# Patient Record
Sex: Female | Born: 1960 | Race: White | Hispanic: No | Marital: Married | Smoking: Never smoker
Health system: Southern US, Community
[De-identification: ages and names within clinical notes are randomized; demographics above are authoritative.]

## PROBLEM LIST (undated history)

## (undated) DIAGNOSIS — S3710XA Unspecified injury of ureter, initial encounter: Secondary | ICD-10-CM

## (undated) DIAGNOSIS — R87610 Atypical squamous cells of undetermined significance on cytologic smear of cervix (ASC-US): Secondary | ICD-10-CM

## (undated) DIAGNOSIS — Z973 Presence of spectacles and contact lenses: Secondary | ICD-10-CM

## (undated) DIAGNOSIS — R32 Unspecified urinary incontinence: Secondary | ICD-10-CM

## (undated) HISTORY — DX: Atypical squamous cells of undetermined significance on cytologic smear of cervix (ASC-US): R87.610

---

## 1986-05-10 HISTORY — PX: OTHER SURGICAL HISTORY: SHX169

## 1997-05-10 HISTORY — PX: APPENDECTOMY: SHX54

## 1997-09-07 HISTORY — PX: OOPHORECTOMY: SHX86

## 2020-08-12 ENCOUNTER — Other Ambulatory Visit: Payer: Self-pay | Admitting: Obstetrics and Gynecology

## 2020-08-12 DIAGNOSIS — R1909 Other intra-abdominal and pelvic swelling, mass and lump: Secondary | ICD-10-CM

## 2020-08-14 ENCOUNTER — Telehealth: Payer: Self-pay | Admitting: *Deleted

## 2020-08-14 NOTE — Telephone Encounter (Signed)
Spoke with the patient and scheduled a new patient appt on 4/26 at 10:30 am. Patient given an arrival time of 10 am. Patient given the address and phone number for the clinic. Patient also given the policy for mask and visitors. Patient has a MRI on 4/22; patient given a date of 4/29, but the patient will be working.

## 2020-08-29 ENCOUNTER — Ambulatory Visit
Admission: RE | Admit: 2020-08-29 | Discharge: 2020-08-29 | Disposition: A | Discharge status: Home / Self Care | Payer: Managed Care, Other (non HMO) | Source: Ambulatory Visit | Attending: Obstetrics and Gynecology | Admitting: Obstetrics and Gynecology

## 2020-08-29 ENCOUNTER — Other Ambulatory Visit: Payer: Self-pay

## 2020-08-29 DIAGNOSIS — R1909 Other intra-abdominal and pelvic swelling, mass and lump: Secondary | ICD-10-CM

## 2020-08-29 MED ORDER — GADOBENATE DIMEGLUMINE 529 MG/ML IV SOLN
11.0000 mL | Freq: Once | INTRAVENOUS | Status: AC | PRN
  Administered 2020-08-29: 11 mL via INTRAVENOUS

## 2020-09-01 ENCOUNTER — Encounter: Payer: Self-pay | Admitting: Gynecologic Oncology

## 2020-09-02 ENCOUNTER — Other Ambulatory Visit: Payer: Self-pay

## 2020-09-02 ENCOUNTER — Inpatient Hospital Stay: Payer: Managed Care, Other (non HMO) | Attending: Gynecologic Oncology | Admitting: Gynecologic Oncology

## 2020-09-02 ENCOUNTER — Other Ambulatory Visit: Payer: Self-pay | Admitting: Gynecologic Oncology

## 2020-09-02 ENCOUNTER — Encounter: Payer: Self-pay | Admitting: Gynecologic Oncology

## 2020-09-02 VITALS — BP 159/75 | HR 69 | Temp 97.5°F | Resp 15 | Ht 59.5 in | Wt 124.8 lb

## 2020-09-02 DIAGNOSIS — D398 Neoplasm of uncertain behavior of other specified female genital organs: Secondary | ICD-10-CM | POA: Diagnosis not present

## 2020-09-02 DIAGNOSIS — R87612 Low grade squamous intraepithelial lesion on cytologic smear of cervix (LGSIL): Secondary | ICD-10-CM

## 2020-09-02 DIAGNOSIS — Z8041 Family history of malignant neoplasm of ovary: Secondary | ICD-10-CM | POA: Insufficient documentation

## 2020-09-02 DIAGNOSIS — N950 Postmenopausal bleeding: Secondary | ICD-10-CM | POA: Diagnosis not present

## 2020-09-02 DIAGNOSIS — Z800 Family history of malignant neoplasm of digestive organs: Secondary | ICD-10-CM | POA: Diagnosis not present

## 2020-09-02 DIAGNOSIS — R87619 Unspecified abnormal cytological findings in specimens from cervix uteri: Secondary | ICD-10-CM | POA: Insufficient documentation

## 2020-09-02 DIAGNOSIS — N888 Other specified noninflammatory disorders of cervix uteri: Secondary | ICD-10-CM

## 2020-09-02 MED ORDER — IBUPROFEN 800 MG PO TABS: 800.0000 mg | ORAL_TABLET | Freq: Three times a day (TID) | ORAL | 0 refills | Status: DC | PRN

## 2020-09-02 MED ORDER — TRAMADOL HCL 50 MG PO TABS: 50.0000 mg | ORAL_TABLET | Freq: Four times a day (QID) | ORAL | 0 refills | Status: DC | PRN

## 2020-09-02 MED ORDER — SENNOSIDES-DOCUSATE SODIUM 8.6-50 MG PO TABS
2.0000 | ORAL_TABLET | Freq: Every day | ORAL | 0 refills | Status: DC
Start: 2020-09-02 — End: 2020-10-10

## 2020-09-02 NOTE — Progress Notes (Signed)
Consult Note: Gyn-Onc  Consult was requested by Dr. Wilhelmenia Blase for the evaluation of Sheila Haynes 60 y.o. female  CC:  Chief Complaint  Patient presents with  . Cervical mass  . postmenopausal bleeding    Assessment/Plan:  [name]  is a 60 y.o.  year old with postmenopausal bleeding, low grade/atypical cells on ECC, atrophic cervix and abnormal cervix on [place].  I reviewed the pathology findings and MRI images with the patient.  On MRI her cervix appears grossly normal and there is no obvious endometrial or cervical pathology visible.  Given the constellation of abnormal findings and symptoms, I would typically recommend a conization of the cervix with D&C.  However, Sheila Haynes has a cervix that is flush with the upper vagina with no palpable or visible tissue amenable to an excisional procedure.  I have some concerns that she may have an occult malignancy given her bleeding symptoms and feel that additional histologic work-up is necessary.  For this reason I am recommending proceeding with hysterectomy for both diagnostic and therapeutic purposes.  I explained to the patient that performing a hysterectomy in the absence of a preexcisional procedure runs the risk of [place] finding an occult cervical cancer that is inadequately treated by the hysterectomy alone and may require radiation therapy.  We will inject the cervix with ICG at the commencement of the procedure in case staging is required based on frozen section results.  I recommend removal of both tubes and ovaries given her concerning family history for possible uterine or ovarian cancer in a second-degree relative.  She will require an EEA sizer for delineation of the vagina.  I counseled the patient regarding risks of surgery including  bleeding, infection, damage to internal organs (such as bladder,ureters, bowels), blood clot, reoperation and rehospitalization. I also counseled the patient regarding anticipated recovery.  She  has an event that she would like to attend on May 5 and plans to travel to [place] or in the second week of June.  I feel that surgery in the second week of May will give her adequate time to recover prior to her anticipated June travel and will still allow for resumption of adjuvant therapy should that be necessary upon her return.   HPI: Ms Sheila Haynes is a 60year old P2 who was seen in consultation at the request of Dr Wilhelmenia Blase for evaluation of postmenopausal bleeding, atypical cervical cells on ECC, cervical mass on ultrasound.  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  She was referred to Dr. Wilhelmenia Blase for colposcopic evaluation approximately 6 months prior to diagnosis.  At that time colposcopy was not adequate due to atrophy.  She was prescribed vaginal estrogen which she took in March 2022.  On July 15, 2020, immediately after assuming use of vaginal estrogen, the patient developed a single episode of heavy vaginal bleeding.  She return to see Dr. Wilhelmenia Blase to further explore and work this up on August 01, 2020.  An endocervical biopsy under ultrasound guidance was performed on 08/01/2020 and revealed fragmented squamous mucosa with atypia bordering of low-grade dysplasia.  No invasive neoplasm was identified.  A transvaginal ultrasound scan was performed that same day in the office and revealed a uterus measuring 10.1 x 5.5 x 4.4 cm with an endometrial thickness of 7.24 mm.  The notes of the ultrasound report a simple right adnexal cyst measuring approximately 5 cm.  There was a's heterogeneous material or mass with increased peripheral vascularity in  the cervix that was appreciated on ultrasound.  The patient returned to the office on 08/06/2020 for an endometrial biopsy to follow-up the thickened endometrial stripe that had been seen on ultrasound.  This revealed benign squamous epithelium with no distinct endometrial tissue present on evaluation.  An MRI of the  pelvis with and without contrast was performed on 08/29/2020 to work-up the abnormal cervical findings on ultrasound imaging.  This revealed a uterus measuring 7.6 x 4.5 x 5.4 cm with no fibroids or other masses identified.  The endometrium was thin and measured approximately 4 mm.  The cervix and vagina were unremarkable.  There was no evidence of a cervical mass.  The right ovary contained a simple appearing cyst measuring 4.6 x 3.5 cm.  The left ovary was not visualized.  There was no constellations of metastatic disease with no lymphadenopathy, free fluid, or carcinomatosis.  Her medical history is fairly unremarkable.  Her surgical history is most significant for a right salpingo-oophorectomy and appendectomy in 1999 for ovarian torsion and 2 prior cesarean sections.  Her gynecologic history is remarkable for 2 prior cesarean sections.  Her family cancer history is a paternal grandmother with uterine and ovarian cancer at age 14, mother with multiple myeloma at age 20, maternal grandfather with throat cancer, and a maternal grandmother with stomach cancer.  She works as an Therapist, music. She lives with her husband.   Current Meds:  Outpatient Encounter Medications as of 09/02/2020  Medication Sig  . Omega-3 Fatty Acids (FISH OIL) 1200 MG CAPS Take 1,200 mg by mouth daily.  . [DISCONTINUED] estradiol (ESTRACE) 0.1 MG/GM vaginal cream estradiol 0.01% (0.1 mg/gram) vaginal cream  INSERT 1 G EVERY DAY BY VAGINAL ROUTE FOR 14 DAYS.   No facility-administered encounter medications on file as of 09/02/2020.    Allergy:  Allergies  Allergen Reactions  . Cephalexin Rash  . Hydromorphone Hcl Other (See Comments)    Severe Headache  . Penicillins Rash  . Sulfa Antibiotics Rash    Social Hx:   Social History   Socioeconomic History  . Marital status: Married    Spouse name: Not on file  . Number of children: Not on file  . Years of education: Not on file  . Highest education level: Not on  file  Occupational History  . Not on file  Tobacco Use  . Smoking status: Never Smoker  . Smokeless tobacco: Never Used  Vaping Use  . Vaping Use: Never used  Substance and Sexual Activity  . Alcohol use: Never  . Drug use: Not on file  . Sexual activity: Yes    Birth control/protection: Post-menopausal  Other Topics Concern  . Not on file  Social History Narrative  . Not on file   Social Determinants of Health   Financial Resource Strain: Not on file  Food Insecurity: Not on file  Transportation Needs: Not on file  Physical Activity: Not on file  Stress: Not on file  Social Connections: Not on file  Intimate Partner Violence: Not on file    Past Surgical Hx:  Past Surgical History:  Procedure Laterality Date  . APPENDECTOMY  1999  . CESAREAN SECTION  06/25/1991  . Cesaren section    . OOPHORECTOMY  09/1997    Past Medical Hx:  Past Medical History:  Diagnosis Date  . Atypical squamous cell changes of undetermined significance (ASCUS) on cervical cytology with negative high risk human papilloma virus (HPV) test result     Past Gynecological History:  C/s x 2, s/p RSO in 1999 No LMP recorded.  Family Hx:  Family History  Problem Relation Age of Onset  . Uterine cancer Mother   . Multiple myeloma Mother   . Hypertension Father   . Heart disease Father   . Diabetes Father   . Stomach cancer Maternal Grandmother   . Brain cancer Maternal Grandfather   . Uterine cancer Paternal Grandmother   . Pancreatic cancer Paternal Uncle   . Pancreatic cancer Paternal Uncle   . Colon cancer Neg Hx   . Ovarian cancer Neg Hx   . Breast cancer Neg Hx   . Prostate cancer Neg Hx     Review of Systems:  Constitutional  Feels well,    ENT Normal appearing ears and nares bilaterally Skin/Breast  No rash, sores, jaundice, itching, dryness Cardiovascular  No chest pain, shortness of breath, or edema  Pulmonary  No cough or wheeze.  Gastro Intestinal  No nausea,  vomitting, or diarrhoea. No bright red blood per rectum, no abdominal pain, change in bowel movement, or constipation.  Genito Urinary  No frequency, urgency, dysuria, + postmenopausal bleeding Musculo Skeletal  No myalgia, arthralgia, joint swelling or pain  Neurologic  No weakness, numbness, change in gait,  Psychology  No depression, anxiety, insomnia.   Vitals:  Blood pressure (!) 159/75, pulse 69, temperature (!) 97.5 F (36.4 C), temperature source Tympanic, resp. rate 15, height 4' 11.5" (1.511 m), weight 124 lb 12.8 oz (56.6 kg), SpO2 100 %.  Physical Exam: WD in NAD Neck  Supple NROM, without any enlargements.  Lymph Node Survey No cervical supraclavicular or inguinal adenopathy Cardiovascular  Well perfused peripheries Lungs  No increased WOB Skin  No rash/lesions/breakdown  Psychiatry  Alert and oriented to person, place, and time  Abdomen  Normoactive bowel sounds, abdomen soft, non-tender and nonobese without evidence of hernia.  Back No CVA tenderness Genito Urinary  Vulva/vagina: Normal external female genitalia.  No lesions. No discharge or bleeding.  Bladder/urethra:  No lesions or masses, well supported bladder  Vagina: atrophic, no visual abnormalities, palpably smooth  Cervix: flush with vagina, cervical stroma not present. The os is punctate and present at the apex of the vagina.  Uterus:  Small, mobile, no parametrial involvement or nodularity.  Adnexa: no palpable masses. Rectal  Good tone, no masses no cul de sac nodularity.  Extremities  No bilateral cyanosis, clubbing or edema.  60 minutes of total time was spent for this patient encounter, including preparation, face-to-face counseling with the patient and coordination of care, review of imaging (results and images), communication with the referring provider and documentation of the encounter.   [name], MD  09/02/2020, 11:20 AM

## 2020-09-02 NOTE — Patient Instructions (Signed)
Preparing for your Surgery  Plan for surgery on Sep 15, 2020 with Dr. Everitt Amber at [facility]. You will be scheduled for a robotic assisted total laparoscopic hysterectomy (removal of the uterus and cervix), bilateral salpingo-oophorectomy (removal of both ovaries and fallopian tubes), possible staging .   Pre-operative Testing -You will receive a phone call from presurgical testing at [facility] to arrange for a pre-operative appointment, labs, and COVID test. The COVID test normally happens 3 days prior to the surgery and they ask that you self quarantine after the test up until surgery to decrease chance of exposure.  -Bring your insurance card, copy of an advanced directive if applicable, medication list  -At that visit, you will be asked to sign a consent for a possible blood transfusion in case a transfusion becomes necessary during surgery.  The need for a blood transfusion is rare but having consent is a necessary part of your care.     -You should not be taking blood thinners or aspirin at least ten days prior to surgery unless instructed by your surgeon.  -STOP FISH OIL NOW. Do not take supplements such as fish oil (omega 3), red yeast rice, turmeric before your surgery. You want to avoid medications with aspirin in them including headache powders such as BC or Goody's), Excedrin migraine.  Day Before Surgery at [place] will be asked to take in a light diet the day before surgery. You will be advised you can have clear liquids up until 3 hours before your surgery.    Eat a light diet the day before surgery.  Examples including soups, broths, toast, yogurt, mashed potatoes.  AVOID GAS PRODUCING FOODS. Things to avoid include carbonated beverages (fizzy beverages, sodas), raw fruits and raw vegetables (uncooked), or beans.   If your bowels are filled with gas, your surgeon will have difficulty visualizing your pelvic organs which increases your  surgical risks.  Your role in recovery Your role is to become active as soon as directed by your doctor, while still giving yourself time to heal.  Rest when you feel tired. You will be asked to do the following in order to speed your recovery:  - Cough and breathe deeply. This helps to clear and expand your lungs and can prevent pneumonia after surgery.  - [place]. Do mild physical activity. Walking or moving your legs help your circulation and body functions return to normal. Do not try to get up or walk alone the first time after surgery.   -If you develop swelling on one leg or the other, pain in the back of your leg, redness/warmth in one of your legs, please call the office or go to the Emergency Room to have a doppler to rule out a blood clot. For shortness of breath, chest pain-seek care in the Emergency Room as soon as possible. - Actively manage your pain. Managing your pain lets you move in comfort. We will ask you to rate your pain on a scale of zero to 10. It is your responsibility to tell your doctor or nurse where and how much you hurt so your pain can be treated.  Special Considerations -If you are diabetic, you may be placed on insulin after surgery to have closer control over your blood sugars to promote healing and recovery.  This does not mean that you will be discharged on insulin.  If applicable, your oral antidiabetics will be resumed when you  are tolerating a solid diet.  -Your final pathology results from surgery should be available around one week after surgery and the results will be relayed to you when available.  -FMLA forms can be faxed to [phone] and please allow 5-7 business days for completion.  Pain Management After Surgery -You have been prescribed your pain medication and bowel regimen medications before surgery so that you can have these available when you are discharged from the hospital. The pain medication is for use ONLY AFTER  surgery and a new prescription will not be given.   -Make sure that you have Tylenol and Ibuprofen at home to use on a regular basis after surgery for pain control. We recommend alternating the medications every hour to six hours since they work differently and are processed in the body differently for pain relief.  -Review the attached handout on narcotic use and their risks and side effects.   Bowel Regimen -You have been prescribed Sennakot-S to take nightly to prevent constipation especially if you are taking the narcotic pain medication intermittently.  It is important to prevent constipation and drink adequate amounts of liquids. You can stop taking this medication when you are not taking pain medication and you are back on your normal bowel routine.  Risks of Surgery Risks of surgery are low but include bleeding, infection, damage to surrounding structures, re-operation, blood clots, and very rarely death.   Blood Transfusion Information (For the consent to be signed before surgery)  We will be checking your blood type before surgery so in case of emergencies, we will know what type of blood you would need.                                            WHAT IS A BLOOD TRANSFUSION?  A transfusion is the replacement of blood or some of its parts. Blood is made up of multiple cells which provide different functions.  Red blood cells carry oxygen and are used for blood loss replacement.  White blood cells fight against infection.  Platelets control bleeding.  Plasma helps clot blood.  Other blood products are available for specialized needs, such as hemophilia or other clotting disorders. BEFORE THE TRANSFUSION  Who gives blood for transfusions?   You may be able to donate blood to be used at a later date on yourself (autologous donation).  Relatives can be asked to donate blood. This is generally not any safer than if you have received blood from a stranger. The same precautions  are taken to ensure safety when a relative's blood is donated.  Healthy volunteers who are fully evaluated to make sure their blood is safe. This is blood bank blood. Transfusion therapy is the safest it has ever been in the practice of medicine. Before blood is taken from a donor, a complete history is taken to make sure that person has no history of diseases nor engages in risky social behavior (examples are intravenous drug use or sexual activity with multiple partners). The donor's travel history is screened to minimize risk of transmitting infections, such as malaria. The donated blood is tested for signs of infectious diseases, such as HIV and hepatitis. The blood is then tested to be sure it is compatible with you in order to minimize the chance of a transfusion reaction. If you or a relative donates blood, this is often done in  anticipation of surgery and is not appropriate for emergency situations. It takes many days to process the donated blood. RISKS AND COMPLICATIONS Although transfusion therapy is very safe and saves many lives, the main dangers of transfusion include:   Getting an infectious disease.  Developing a transfusion reaction. This is an allergic reaction to something in the blood you were given. Every precaution is taken to prevent this. The decision to have a blood transfusion has been considered carefully by your caregiver before blood is given. Blood is not given unless the benefits outweigh the risks.  AFTER SURGERY INSTRUCTIONS  Return to work: 4 weeks if applicable  Activity: 1. Be up and out of the bed during the day.  Take a nap if needed.  You may walk up steps but be careful and use the hand rail.  Stair climbing will tire you more than you think, you may need to stop part way and rest.   2. No lifting or straining for 6 weeks over 10 pounds. No pushing, pulling, straining for 6 weeks.  3. No driving for 1 week(s).  Do not drive if you are taking narcotic pain  medicine and make sure that your reaction time has returned.   4. You can shower as soon as the next day after surgery. Shower daily.  Use your regular soap and water (not directly on the incision) and pat your incision(s) dry afterwards; don't rub.  No tub baths or submerging your body in water until cleared by your surgeon. If you have the soap that was given to you by pre-surgical testing that was used before surgery, you do not need to use it afterwards because this can irritate your incisions.   5. No sexual activity and nothing in the vagina for 8 weeks.  6. You may experience a small amount of clear drainage from your incisions, which is normal.  If the drainage persists, increases, or changes color please call the office.  7. Do not use creams, lotions, or ointments such as neosporin on your incisions after surgery until advised by your surgeon because they can cause removal of the dermabond glue on your incisions.    8. You may experience vaginal spotting after surgery or around the 6-8 week mark from surgery when the stitches at the top of the vagina begin to dissolve.  The spotting is normal but if you experience heavy bleeding, call our office.  9. Take Tylenol or ibuprofen first for pain and only use narcotic pain medication for severe pain not relieved by the Tylenol or Ibuprofen.  Monitor your Tylenol intake to a max of 4,000 mg in a 24 hour period. You can alternate these medications after surgery.  Diet: 1. Low sodium Heart Healthy Diet is recommended but you are cleared to resume your normal (before surgery) diet after your procedure.  2. It is safe to use a laxative, such as Miralax or Colace, if you have difficulty moving your bowels. You have been prescribed Sennakot at bedtime every evening to keep bowel movements regular and to prevent constipation.    Wound Care: 1. Keep clean and dry.  Shower daily.  Reasons to call the Doctor:  Fever - Oral temperature greater than  100.4 degrees Fahrenheit  Foul-smelling vaginal discharge  Difficulty urinating  Nausea and vomiting  Increased pain at the site of the incision that is unrelieved with pain medicine.  Difficulty breathing with or without chest pain  New calf pain especially if only on one side  Sudden, continuing increased vaginal bleeding with or without clots.   Contacts: For questions or concerns you should contact:  Dr. Everitt Amber at [phone]  Joylene John, NP at [phone]  After Hours: call [phone] and have the GYN Oncologist paged/contacted (after 5 pm or on the weekends).  Messages sent via mychart are for non-urgent matters and are not responded to after hours so for urgent needs, please call the after hours number.

## 2020-09-02 NOTE — H&P (View-Only) (Signed)
Consult Note: Gyn-Onc  Consult was requested by Dr. Wilhelmenia Blase for the evaluation of Sheila Haynes 60 y.o. female  CC:  Chief Complaint  Patient presents with  . Cervical mass  . postmenopausal bleeding    Assessment/Plan:  Sheila Haynes  is a 60 y.o.  year old with postmenopausal bleeding, low grade/atypical cells on ECC, atrophic cervix and abnormal cervix on [place].  I reviewed the pathology findings and MRI images with the patient.  On MRI her cervix appears grossly normal and there is no obvious endometrial or cervical pathology visible.  Given the constellation of abnormal findings and symptoms, I would typically recommend a conization of the cervix with D&C.  However, Sheila Haynes has a cervix that is flush with the upper vagina with no palpable or visible tissue amenable to an excisional procedure.  I have some concerns that she may have an occult malignancy given her bleeding symptoms and feel that additional histologic work-up is necessary.  For this reason I am recommending proceeding with hysterectomy for both diagnostic and therapeutic purposes.  I explained to the patient that performing a hysterectomy in the absence of a preexcisional procedure runs the risk of [place] finding an occult cervical cancer that is inadequately treated by the hysterectomy alone and may require radiation therapy.  We will inject the cervix with ICG at the commencement of the procedure in case staging is required based on frozen section results.  I recommend removal of both tubes and ovaries given her concerning family history for possible uterine or ovarian cancer in a second-degree relative.  She will require an EEA sizer for delineation of the vagina.  I counseled the patient regarding risks of surgery including  bleeding, infection, damage to internal organs (such as bladder,ureters, bowels), blood clot, reoperation and rehospitalization. I also counseled the patient regarding anticipated recovery.  She  has an event that she would like to attend on May 5 and plans to travel to [place] or in the second week of June.  I feel that surgery in the second week of May will give her adequate time to recover prior to her anticipated June travel and will still allow for resumption of adjuvant therapy should that be necessary upon her return.   HPI: Sheila Haynes is a 60year old P2 who was seen in consultation at the request of Dr Wilhelmenia Blase for evaluation of postmenopausal bleeding, atypical cervical cells on ECC, cervical mass on ultrasound.  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  She was referred to Dr. Wilhelmenia Blase for colposcopic evaluation approximately 6 months prior to diagnosis.  At that time colposcopy was not adequate due to atrophy.  She was prescribed vaginal estrogen which she took in March 2022.  On July 15, 2020, immediately after assuming use of vaginal estrogen, the patient developed a single episode of heavy vaginal bleeding.  She return to see Dr. Wilhelmenia Blase to further explore and work this up on August 01, 2020.  An endocervical biopsy under ultrasound guidance was performed on 08/01/2020 and revealed fragmented squamous mucosa with atypia bordering of low-grade dysplasia.  No invasive neoplasm was identified.  A transvaginal ultrasound scan was performed that same day in the office and revealed a uterus measuring 10.1 x 5.5 x 4.4 cm with an endometrial thickness of 7.24 mm.  The notes of the ultrasound report a simple right adnexal cyst measuring approximately 5 cm.  There was a's heterogeneous material or mass with increased peripheral vascularity in  the cervix that was appreciated on ultrasound.  The patient returned to the office on 08/06/2020 for an endometrial biopsy to follow-up the thickened endometrial stripe that had been seen on ultrasound.  This revealed benign squamous epithelium with no distinct endometrial tissue present on evaluation.  An MRI of the  pelvis with and without contrast was performed on 08/29/2020 to work-up the abnormal cervical findings on ultrasound imaging.  This revealed a uterus measuring 7.6 x 4.5 x 5.4 cm with no fibroids or other masses identified.  The endometrium was thin and measured approximately 4 mm.  The cervix and vagina were unremarkable.  There was no evidence of a cervical mass.  The right ovary contained a simple appearing cyst measuring 4.6 x 3.5 cm.  The left ovary was not visualized.  There was no constellations of metastatic disease with no lymphadenopathy, free fluid, or carcinomatosis.  Her medical history is fairly unremarkable.  Her surgical history is most significant for a right salpingo-oophorectomy and appendectomy in 1999 for ovarian torsion and 2 prior cesarean sections.  Her gynecologic history is remarkable for 2 prior cesarean sections.  Her family cancer history is a paternal grandmother with uterine and ovarian cancer at age 14, mother with multiple myeloma at age 20, maternal grandfather with throat cancer, and a maternal grandmother with stomach cancer.  She works as an Therapist, music. She lives with her husband.   Current Meds:  Outpatient Encounter Medications as of 09/02/2020  Medication Sig  . Omega-3 Fatty Acids (FISH OIL) 1200 MG CAPS Take 1,200 mg by mouth daily.  . [DISCONTINUED] estradiol (ESTRACE) 0.1 MG/GM vaginal cream estradiol 0.01% (0.1 mg/gram) vaginal cream  INSERT 1 G EVERY DAY BY VAGINAL ROUTE FOR 14 DAYS.   No facility-administered encounter medications on file as of 09/02/2020.    Allergy:  Allergies  Allergen Reactions  . Cephalexin Rash  . Hydromorphone Hcl Other (See Comments)    Severe Headache  . Penicillins Rash  . Sulfa Antibiotics Rash    Social Hx:   Social History   Socioeconomic History  . Marital status: Married    Spouse name: Not on file  . Number of children: Not on file  . Years of education: Not on file  . Highest education level: Not on  file  Occupational History  . Not on file  Tobacco Use  . Smoking status: Never Smoker  . Smokeless tobacco: Never Used  Vaping Use  . Vaping Use: Never used  Substance and Sexual Activity  . Alcohol use: Never  . Drug use: Not on file  . Sexual activity: Yes    Birth control/protection: Post-menopausal  Other Topics Concern  . Not on file  Social History Narrative  . Not on file   Social Determinants of Health   Financial Resource Strain: Not on file  Food Insecurity: Not on file  Transportation Needs: Not on file  Physical Activity: Not on file  Stress: Not on file  Social Connections: Not on file  Intimate Partner Violence: Not on file    Past Surgical Hx:  Past Surgical History:  Procedure Laterality Date  . APPENDECTOMY  1999  . CESAREAN SECTION  06/25/1991  . Cesaren section    . OOPHORECTOMY  09/1997    Past Medical Hx:  Past Medical History:  Diagnosis Date  . Atypical squamous cell changes of undetermined significance (ASCUS) on cervical cytology with negative high risk human papilloma virus (HPV) test result     Past Gynecological History:  C/s x 2, s/p RSO in 1999 No LMP recorded.  Family Hx:  Family History  Problem Relation Age of Onset  . Uterine cancer Mother   . Multiple myeloma Mother   . Hypertension Father   . Heart disease Father   . Diabetes Father   . Stomach cancer Maternal Grandmother   . Brain cancer Maternal Grandfather   . Uterine cancer Paternal Grandmother   . Pancreatic cancer Paternal Uncle   . Pancreatic cancer Paternal Uncle   . Colon cancer Neg Hx   . Ovarian cancer Neg Hx   . Breast cancer Neg Hx   . Prostate cancer Neg Hx     Review of Systems:  Constitutional  Feels well,    ENT Normal appearing ears and nares bilaterally Skin/Breast  No rash, sores, jaundice, itching, dryness Cardiovascular  No chest pain, shortness of breath, or edema  Pulmonary  No cough or wheeze.  Gastro Intestinal  No nausea,  vomitting, or diarrhoea. No bright red blood per rectum, no abdominal pain, change in bowel movement, or constipation.  Genito Urinary  No frequency, urgency, dysuria, + postmenopausal bleeding Musculo Skeletal  No myalgia, arthralgia, joint swelling or pain  Neurologic  No weakness, numbness, change in gait,  Psychology  No depression, anxiety, insomnia.   Vitals:  Blood pressure (!) 159/75, pulse 69, temperature (!) 97.5 F (36.4 C), temperature source Tympanic, resp. rate 15, height 4' 11.5" (1.511 m), weight 124 lb 12.8 oz (56.6 kg), SpO2 100 %.  Physical Exam: WD in NAD Neck  Supple NROM, without any enlargements.  Lymph Node Survey No cervical supraclavicular or inguinal adenopathy Cardiovascular  Well perfused peripheries Lungs  No increased WOB Skin  No rash/lesions/breakdown  Psychiatry  Alert and oriented to person, place, and time  Abdomen  Normoactive bowel sounds, abdomen soft, non-tender and nonobese without evidence of hernia.  Back No CVA tenderness Genito Urinary  Vulva/vagina: Normal external female genitalia.  No lesions. No discharge or bleeding.  Bladder/urethra:  No lesions or masses, well supported bladder  Vagina: atrophic, no visual abnormalities, palpably smooth  Cervix: flush with vagina, cervical stroma not present. The os is punctate and present at the apex of the vagina.  Uterus:  Small, mobile, no parametrial involvement or nodularity.  Adnexa: no palpable masses. Rectal  Good tone, no masses no cul de sac nodularity.  Extremities  No bilateral cyanosis, clubbing or edema.  60 minutes of total time was spent for this patient encounter, including preparation, face-to-face counseling with the patient and coordination of care, review of imaging (results and images), communication with the referring provider and documentation of the encounter.   [name], MD  09/02/2020, 11:20 AM

## 2020-09-08 ENCOUNTER — Other Ambulatory Visit: Payer: Self-pay

## 2020-09-08 ENCOUNTER — Encounter (HOSPITAL_BASED_OUTPATIENT_CLINIC_OR_DEPARTMENT_OTHER): Payer: Self-pay | Admitting: Gynecologic Oncology

## 2020-09-08 NOTE — Progress Notes (Signed)
Spoke w/ via phone for pre-op interview---PT Lab needs dos---- NONE              Lab results------cbc cmp T & s, ua COVID test ------09-12-2020 Arrive at -------630 am 09-12-2020 NPO after MN NO Solid Food.  Clear liquids from MN until---530 am then npo Med rec completed Medications to take morning of surgery ----- none Diabetic medication -----n/a Patient instructed to bring photo id and insurance card day of surgery Patient aware to have Driver (ride ) / caregiver  Spouse eddie will stay   for 24 hours after surgery  Patient Special Instructions ----pt given extended stay instructions Pre-Op special Istructions -----none Patient verbalized understanding of instructions that were given at this phone interview. Patient denies shortness of breath, chest pain, fever, cough at this phone interview.

## 2020-09-08 NOTE — Progress Notes (Signed)
YOU ARE SCHEDULED FOR A COVID TEST 09-12-2020 @ 1030. THIS TEST MUST BE DONE BEFORE SURGERY. GO TO  [place]. [place], [place], IT IS APPROXIMATELY 2 MINUTES PAST [employer] ON THE RIGHT AND REMAIN IN YOUR CAR, THIS IS A DRIVE UP TEST. ONCE YOUR COVID TEST IS DONE PLEASE FOLLOW ALL THE QUARANTINE  INSTRUCTIONS GIVEN IN YOUR HANDOUT.      Your procedure is scheduled on 09-15-2020  Report to [facility].   Call this number if you have problems the morning of surgery  :[phone].   OUR ADDRESS IS [address].  WE ARE LOCATED IN [facility].  PLEASE BRING YOUR INSURANCE CARD AND PHOTO ID DAY OF SURGERY.  ONLY ONE PERSON ALLOWED IN FACILITY WAITING AREA.                                     REMEMBER:  DO NOT EAT FOOD, CANDY GUM OR MINTS  AFTER MIDNIGHT . YOU MAY HAVE CLEAR LIQUIDS FROM MIDNIGHT UNTIL 530 AM . NO CLEAR LIQUIDS AFTER  530 AM DAY OF SURGERY.   YOU MAY  BRUSH YOUR TEETH MORNING OF SURGERY AND RINSE YOUR MOUTH OUT, NO CHEWING GUM CANDY OR MINTS.    CLEAR LIQUID DIET   Foods Allowed                                                                     Foods Excluded  Coffee and tea, regular and decaf                             liquids that you cannot  Plain Jell-O any favor except red or purple                                           see through such as: Fruit ices (not with fruit pulp)                                     milk, soups, orange juice  Iced Popsicles                                    All solid food Carbonated beverages, regular and diet                                    Cranberry, grape and apple juices Sports drinks like Gatorade Lightly seasoned clear broth or consume(fat free) Sugar, honey syrup  Sample Menu Breakfast                                Lunch  Supper Cranberry juice                    Beef broth                            Chicken  broth Jell-O                                     Grape juice                           Apple juice Coffee or tea                        Jell-O                                      Popsicle                                                Coffee or tea                        Coffee or tea  _____________________________________________________________________     TAKE THESE MEDICATIONS MORNING OF SURGERY WITH A SIP OF WATER:  NONE  ONE VISITOR IS ALLOWED IN WAITING ROOM ONLY DAY OF SURGERY.  NO VISITOR MAY SPEND THE NIGHT.  VISITOR ARE ALLOWED TO STAY UNTIL 800 PM.                                    DO NOT WEAR JEWERLY, MAKE UP. DO NOT WEAR LOTIONS, POWDERS, PERFUMES OR DEODORANT. DO NOT SHAVE FOR 24 HOURS PRIOR TO DAY OF SURGERY. MEN MAY SHAVE FACE AND NECK. CONTACTS, GLASSES, OR DENTURES MAY NOT BE WORN TO SURGERY.                                    [facility] IS NOT RESPONSIBLE  FOR ANY BELONGINGS.                                                                    Marland Kitchen           [place] - Preparing for Surgery Before surgery, you can play an important role.  Because skin is not sterile, your skin needs to be as free of germs as possible.  You can reduce the number of germs on your skin by washing with CHG (chlorahexidine gluconate) soap before surgery.  CHG is an antiseptic cleaner which kills germs and bonds with the skin to continue killing germs even after washing. Please DO NOT use if you have an allergy to CHG or antibacterial soaps.  If your skin becomes reddened/irritated stop  using the CHG and inform your nurse when you arrive at Short Stay. Do not shave (including legs and underarms) for at least 48 hours prior to the first CHG shower.  You may shave your face/neck. Please follow these instructions carefully:  1.  Shower with CHG Soap the night before surgery and the  morning of Surgery.  2.  If you choose to wash your hair, wash your hair first as usual with your  normal   shampoo.  3.  After you shampoo, rinse your hair and body thoroughly to remove the  shampoo.                            4.  Use CHG as you would any other liquid soap.  You can apply chg directly  to the skin and wash                      Gently with a scrungie or clean washcloth.  5.  Apply the CHG Soap to your body ONLY FROM THE NECK DOWN.   Do not use on face/ open                           Wound or open sores. Avoid contact with eyes, ears mouth and genitals (private parts).                       Wash face,  Genitals (private parts) with your normal soap.             6.  Wash thoroughly, paying special attention to the area where your surgery  will be performed.  7.  Thoroughly rinse your body with warm water from the neck down.  8.  DO NOT shower/wash with your normal soap after using and rinsing off  the CHG Soap.                9.  Pat yourself dry with a clean towel.            10.  Wear clean pajamas.            11.  Place clean sheets on your bed the night of your first shower and do not  sleep with pets. Day of Surgery : Do not apply any lotions/deodorants the morning of surgery.  Please wear clean clothes to the hospital/surgery center.  FAILURE TO FOLLOW THESE INSTRUCTIONS MAY RESULT IN THE CANCELLATION OF YOUR SURGERY PATIENT SIGNATURE_________________________________  NURSE SIGNATURE__________________________________  ________________________________________________________________________                                                        QUESTIONS Sheila Haynes PRE OP NURSE PHONE [phone]

## 2020-09-12 ENCOUNTER — Other Ambulatory Visit: Payer: Self-pay

## 2020-09-12 ENCOUNTER — Other Ambulatory Visit (HOSPITAL_COMMUNITY)
Admission: RE | Admit: 2020-09-12 | Discharge: 2020-09-12 | Disposition: A | Discharge status: Home / Self Care | Payer: Managed Care, Other (non HMO) | Source: Ambulatory Visit | Attending: Gynecologic Oncology | Admitting: Gynecologic Oncology

## 2020-09-12 ENCOUNTER — Encounter (HOSPITAL_BASED_OUTPATIENT_CLINIC_OR_DEPARTMENT_OTHER): Payer: Self-pay | Admitting: Gynecologic Oncology

## 2020-09-12 ENCOUNTER — Telehealth: Payer: Self-pay | Admitting: Gynecologic Oncology

## 2020-09-12 ENCOUNTER — Encounter (HOSPITAL_COMMUNITY)
Admission: RE | Admit: 2020-09-12 | Discharge: 2020-09-12 | Disposition: A | Discharge status: Home / Self Care | Payer: Managed Care, Other (non HMO) | Source: Ambulatory Visit | Attending: Gynecologic Oncology | Admitting: Gynecologic Oncology

## 2020-09-12 DIAGNOSIS — Z20822 Contact with and (suspected) exposure to covid-19: Secondary | ICD-10-CM | POA: Insufficient documentation

## 2020-09-12 DIAGNOSIS — Z01812 Encounter for preprocedural laboratory examination: Secondary | ICD-10-CM | POA: Insufficient documentation

## 2020-09-12 LAB — COMPREHENSIVE METABOLIC PANEL
ALT: 16 U/L (ref 0–44)
AST: 18 U/L (ref 15–41)
Albumin: 4 g/dL (ref 3.5–5.0)
Alkaline Phosphatase: 51 U/L (ref 38–126)
Anion gap: 9 (ref 5–15)
BUN: 15 mg/dL (ref 6–20)
CO2: 26 mmol/L (ref 22–32)
Calcium: 9.4 mg/dL (ref 8.9–10.3)
Chloride: 104 mmol/L (ref 98–111)
Creatinine, Ser: 0.61 mg/dL (ref 0.44–1.00)
GFR, Estimated: >60 mL/min (ref >60)
Glucose, Bld: 85 mg/dL (ref 70–99)
Potassium: 4.3 mmol/L (ref 3.5–5.1)
Sodium: 139 mmol/L (ref 135–145)
Total Bilirubin: 1.2 mg/dL (ref 0.3–1.2)
Total Protein: 7.1 g/dL (ref 6.5–8.1)

## 2020-09-12 LAB — URINALYSIS, ROUTINE W REFLEX MICROSCOPIC
Bilirubin Urine: NEGATIVE
Glucose, UA: NEGATIVE mg/dL
Hgb urine dipstick: NEGATIVE
Ketones, ur: NEGATIVE mg/dL
Leukocytes,Ua: NEGATIVE
Nitrite: NEGATIVE
Protein, ur: NEGATIVE mg/dL
Specific Gravity, Urine: 1.005 (ref 1.005–1.030)
pH: 7 (ref 5.0–8.0)

## 2020-09-12 LAB — CBC
HCT: 44.6 % (ref 36.0–46.0)
Hemoglobin: 13.9 g/dL (ref 12.0–15.0)
MCH: 27.5 pg (ref 26.0–34.0)
MCHC: 31.2 g/dL (ref 30.0–36.0)
MCV: 88.1 fL (ref 80.0–100.0)
Platelets: 264 10*3/uL (ref 150–400)
RBC: 5.06 MIL/uL (ref 3.87–5.11)
RDW: 12.1 % (ref 11.5–15.5)
WBC: 8 10*3/uL (ref 4.0–10.5)
nRBC: 0 % (ref 0.0–0.2)

## 2020-09-12 NOTE — Anesthesia Preprocedure Evaluation (Addendum)
Anesthesia Evaluation  Patient identified by MRN, date of birth, ID band Patient awake    Reviewed: Allergy & Precautions, NPO status , Patient's Chart, lab work & pertinent test results  Airway Mallampati: II  TM Distance: >3 FB Neck ROM: Full    Dental no notable dental hx. (+) Dental Advisory Given, Caps   Pulmonary neg pulmonary ROS,    Pulmonary exam normal breath sounds clear to auscultation       Cardiovascular negative cardio ROS Normal cardiovascular exam Rhythm:Regular Rate:Normal     Neuro/Psych negative neurological ROS  negative psych ROS   GI/Hepatic negative GI ROS, Neg liver ROS,   Endo/Other  negative endocrine ROS  Renal/GU negative Renal ROS  negative genitourinary   Musculoskeletal negative musculoskeletal ROS (+)   Abdominal   Peds  Hematology negative hematology ROS (+)   Anesthesia Other Findings   Reproductive/Obstetrics Cervical mass PMB                            Anesthesia Physical Anesthesia Plan  ASA: II  Anesthesia Plan: General   Post-op Pain Management:    Induction: Intravenous  PONV Risk Score and Plan: 4 or greater and Treatment may vary due to age or medical condition, Midazolam, Ondansetron, Dexamethasone, Promethazine and Scopolamine patch - Pre-op  Airway Management Planned: Oral ETT  Additional Equipment:   Intra-op Plan:   Post-operative Plan: Extubation in OR  Informed Consent: I have reviewed the patients History and Physical, chart, labs and discussed the procedure including the risks, benefits and alternatives for the proposed anesthesia with the patient or authorized representative who has indicated his/her understanding and acceptance.     Dental advisory given  Plan Discussed with: CRNA and Anesthesiologist  Anesthesia Plan Comments:        Anesthesia Quick Evaluation

## 2020-09-12 NOTE — Telephone Encounter (Signed)
Attempted to call patient to check in with her to see if she had any questions or concerns before her surgery on Monday, May 9.  Left a message for the patient on her home number and cell number and asked her to please call the office for any questions or concerns.

## 2020-09-13 LAB — SARS CORONAVIRUS 2 (TAT 6-24 HRS): SARS Coronavirus 2: NEGATIVE

## 2020-09-15 ENCOUNTER — Ambulatory Visit (HOSPITAL_BASED_OUTPATIENT_CLINIC_OR_DEPARTMENT_OTHER): Payer: Managed Care, Other (non HMO) | Admitting: Anesthesiology

## 2020-09-15 ENCOUNTER — Ambulatory Visit (HOSPITAL_BASED_OUTPATIENT_CLINIC_OR_DEPARTMENT_OTHER)
Admission: RE | Admit: 2020-09-15 | Discharge: 2020-09-15 | Disposition: A | Discharge status: Home / Self Care | Payer: Managed Care, Other (non HMO) | Attending: Gynecologic Oncology | Admitting: Gynecologic Oncology

## 2020-09-15 ENCOUNTER — Encounter (HOSPITAL_BASED_OUTPATIENT_CLINIC_OR_DEPARTMENT_OTHER)
Admission: RE | Disposition: A | Discharge status: Home / Self Care | Payer: Self-pay | Source: Home / Self Care | Attending: Gynecologic Oncology

## 2020-09-15 ENCOUNTER — Encounter (HOSPITAL_BASED_OUTPATIENT_CLINIC_OR_DEPARTMENT_OTHER): Payer: Self-pay | Admitting: Gynecologic Oncology

## 2020-09-15 DIAGNOSIS — N838 Other noninflammatory disorders of ovary, fallopian tube and broad ligament: Secondary | ICD-10-CM | POA: Insufficient documentation

## 2020-09-15 DIAGNOSIS — Z8041 Family history of malignant neoplasm of ovary: Secondary | ICD-10-CM | POA: Insufficient documentation

## 2020-09-15 DIAGNOSIS — Z885 Allergy status to narcotic agent status: Secondary | ICD-10-CM | POA: Insufficient documentation

## 2020-09-15 DIAGNOSIS — Z880 Allergy status to penicillin: Secondary | ICD-10-CM | POA: Diagnosis not present

## 2020-09-15 DIAGNOSIS — N800 Endometriosis of uterus: Secondary | ICD-10-CM | POA: Insufficient documentation

## 2020-09-15 DIAGNOSIS — N950 Postmenopausal bleeding: Secondary | ICD-10-CM | POA: Insufficient documentation

## 2020-09-15 DIAGNOSIS — Z8049 Family history of malignant neoplasm of other genital organs: Secondary | ICD-10-CM | POA: Diagnosis not present

## 2020-09-15 DIAGNOSIS — N888 Other specified noninflammatory disorders of cervix uteri: Secondary | ICD-10-CM | POA: Diagnosis present

## 2020-09-15 DIAGNOSIS — N83201 Unspecified ovarian cyst, right side: Secondary | ICD-10-CM

## 2020-09-15 DIAGNOSIS — N879 Dysplasia of cervix uteri, unspecified: Secondary | ICD-10-CM | POA: Insufficient documentation

## 2020-09-15 DIAGNOSIS — Z881 Allergy status to other antibiotic agents status: Secondary | ICD-10-CM | POA: Insufficient documentation

## 2020-09-15 DIAGNOSIS — D259 Leiomyoma of uterus, unspecified: Secondary | ICD-10-CM | POA: Insufficient documentation

## 2020-09-15 DIAGNOSIS — D269 Other benign neoplasm of uterus, unspecified: Secondary | ICD-10-CM | POA: Diagnosis not present

## 2020-09-15 DIAGNOSIS — Z882 Allergy status to sulfonamides status: Secondary | ICD-10-CM | POA: Insufficient documentation

## 2020-09-15 DIAGNOSIS — R87612 Low grade squamous intraepithelial lesion on cytologic smear of cervix (LGSIL): Secondary | ICD-10-CM | POA: Diagnosis present

## 2020-09-15 HISTORY — PX: ROBOTIC ASSISTED TOTAL HYSTERECTOMY WITH BILATERAL SALPINGO OOPHERECTOMY: SHX6086

## 2020-09-15 HISTORY — DX: Presence of spectacles and contact lenses: Z97.3

## 2020-09-15 LAB — TYPE AND SCREEN
ABO/RH(D): A NEG
Antibody Screen: NEGATIVE

## 2020-09-15 LAB — ABO/RH: ABO/RH(D): A NEG

## 2020-09-15 SURGERY — HYSTERECTOMY, TOTAL, ROBOT-ASSISTED, LAPAROSCOPIC, WITH BILATERAL SALPINGO-OOPHORECTOMY
Anesthesia: General | Site: Abdomen | Laterality: Bilateral

## 2020-09-15 MED ORDER — ROCURONIUM BROMIDE 10 MG/ML (PF) SYRINGE
PREFILLED_SYRINGE | INTRAVENOUS | Status: AC
  Filled 2020-09-15: qty 10

## 2020-09-15 MED ORDER — CLINDAMYCIN PHOSPHATE 900 MG/50ML IV SOLN
INTRAVENOUS | Status: AC
  Filled 2020-09-15: qty 50

## 2020-09-15 MED ORDER — SCOPOLAMINE 1 MG/3DAYS TD PT72
1.0000 | MEDICATED_PATCH | TRANSDERMAL | Status: DC
Start: 2020-09-15 — End: 2020-09-15

## 2020-09-15 MED ORDER — ACETAMINOPHEN 500 MG PO TABS
1000.0000 mg | ORAL_TABLET | ORAL | Status: AC
  Administered 2020-09-15: 1000 mg via ORAL

## 2020-09-15 MED ORDER — ARTIFICIAL TEARS OPHTHALMIC OINT
TOPICAL_OINTMENT | OPHTHALMIC | Status: AC
  Filled 2020-09-15: qty 3.5

## 2020-09-15 MED ORDER — STERILE WATER FOR INJECTION IJ SOLN
INTRAMUSCULAR | Status: DC | PRN
  Administered 2020-09-15: 4 mL

## 2020-09-15 MED ORDER — LIDOCAINE HCL (PF) 2 % IJ SOLN
INTRAMUSCULAR | Status: DC | PRN
  Administered 2020-09-15: 1.5 mg/kg/h via INTRADERMAL

## 2020-09-15 MED ORDER — MIDAZOLAM HCL 2 MG/2ML IJ SOLN
INTRAMUSCULAR | Status: AC
  Filled 2020-09-15: qty 2

## 2020-09-15 MED ORDER — FENTANYL CITRATE (PF) 100 MCG/2ML IJ SOLN: 25.0000 ug | INTRAMUSCULAR | Status: DC | PRN

## 2020-09-15 MED ORDER — LIDOCAINE 2% (20 MG/ML) 5 ML SYRINGE
INTRAMUSCULAR | Status: DC | PRN
  Administered 2020-09-15: 80 mg via INTRAVENOUS

## 2020-09-15 MED ORDER — KETOROLAC TROMETHAMINE 30 MG/ML IJ SOLN
INTRAMUSCULAR | Status: AC
  Filled 2020-09-15: qty 1

## 2020-09-15 MED ORDER — FENTANYL CITRATE (PF) 100 MCG/2ML IJ SOLN
INTRAMUSCULAR | Status: DC | PRN
  Administered 2020-09-15 (×2): 50 ug via INTRAVENOUS
  Administered 2020-09-15: 150 ug via INTRAVENOUS
  Administered 2020-09-15: 50 ug via INTRAVENOUS

## 2020-09-15 MED ORDER — ROCURONIUM BROMIDE 10 MG/ML (PF) SYRINGE
PREFILLED_SYRINGE | INTRAVENOUS | Status: DC | PRN
  Administered 2020-09-15: 20 mg via INTRAVENOUS
  Administered 2020-09-15: 60 mg via INTRAVENOUS

## 2020-09-15 MED ORDER — DEXAMETHASONE SODIUM PHOSPHATE 10 MG/ML IJ SOLN
INTRAMUSCULAR | Status: DC | PRN
  Administered 2020-09-15: 10 mg via INTRAVENOUS

## 2020-09-15 MED ORDER — ONDANSETRON HCL 4 MG/2ML IJ SOLN: 4.0000 mg | Freq: Once | INTRAMUSCULAR | Status: DC | PRN

## 2020-09-15 MED ORDER — GABAPENTIN 100 MG PO CAPS
ORAL_CAPSULE | ORAL | Status: AC
  Filled 2020-09-15: qty 2

## 2020-09-15 MED ORDER — CLINDAMYCIN PHOSPHATE 900 MG/50ML IV SOLN
900.0000 mg | INTRAVENOUS | Status: AC
  Administered 2020-09-15: 900 mg via INTRAVENOUS

## 2020-09-15 MED ORDER — PROPOFOL 10 MG/ML IV BOLUS
INTRAVENOUS | Status: DC | PRN
  Administered 2020-09-15: 120 mg via INTRAVENOUS

## 2020-09-15 MED ORDER — FENTANYL CITRATE (PF) 100 MCG/2ML IJ SOLN
INTRAMUSCULAR | Status: AC
  Filled 2020-09-15: qty 2

## 2020-09-15 MED ORDER — LIDOCAINE HCL 2 % IJ SOLN
INTRAMUSCULAR | Status: AC
  Filled 2020-09-15: qty 10

## 2020-09-15 MED ORDER — PHENYLEPHRINE 40 MCG/ML (10ML) SYRINGE FOR IV PUSH (FOR BLOOD PRESSURE SUPPORT)
PREFILLED_SYRINGE | INTRAVENOUS | Status: AC
  Filled 2020-09-15: qty 10

## 2020-09-15 MED ORDER — BUPIVACAINE HCL 0.25 % IJ SOLN
INTRAMUSCULAR | Status: DC | PRN
  Administered 2020-09-15: 15 mL

## 2020-09-15 MED ORDER — LACTATED RINGERS IV SOLN: INTRAVENOUS | Status: DC

## 2020-09-15 MED ORDER — SCOPOLAMINE 1 MG/3DAYS TD PT72
MEDICATED_PATCH | TRANSDERMAL | Status: AC
  Filled 2020-09-15: qty 1

## 2020-09-15 MED ORDER — SODIUM CHLORIDE 0.9 % IR SOLN
Status: DC | PRN
  Administered 2020-09-15: 1000 mL

## 2020-09-15 MED ORDER — ACETAMINOPHEN 500 MG PO TABS
ORAL_TABLET | ORAL | Status: AC
  Filled 2020-09-15: qty 2

## 2020-09-15 MED ORDER — DEXAMETHASONE SODIUM PHOSPHATE 10 MG/ML IJ SOLN
INTRAMUSCULAR | Status: AC
  Filled 2020-09-15: qty 1

## 2020-09-15 MED ORDER — DEXAMETHASONE SODIUM PHOSPHATE 10 MG/ML IJ SOLN
4.0000 mg | INTRAMUSCULAR | Status: DC
Start: 2020-09-15 — End: 2020-09-15

## 2020-09-15 MED ORDER — LIDOCAINE 2% (20 MG/ML) 5 ML SYRINGE
INTRAMUSCULAR | Status: AC
  Filled 2020-09-15: qty 5

## 2020-09-15 MED ORDER — SCOPOLAMINE 1 MG/3DAYS TD PT72
1.0000 | MEDICATED_PATCH | TRANSDERMAL | Status: DC
  Administered 2020-09-15: 1.5 mg via TRANSDERMAL

## 2020-09-15 MED ORDER — SODIUM CHLORIDE (PF) 0.9 % IJ SOLN
INTRAMUSCULAR | Status: AC
  Filled 2020-09-15: qty 10

## 2020-09-15 MED ORDER — ARTIFICIAL TEARS OPHTHALMIC OINT
TOPICAL_OINTMENT | OPHTHALMIC | Status: DC | PRN
  Administered 2020-09-15: 1 via OPHTHALMIC

## 2020-09-15 MED ORDER — ONDANSETRON HCL 4 MG/2ML IJ SOLN
INTRAMUSCULAR | Status: DC | PRN
  Administered 2020-09-15: 4 mg via INTRAVENOUS

## 2020-09-15 MED ORDER — CIPROFLOXACIN IN D5W 400 MG/200ML IV SOLN
INTRAVENOUS | Status: AC
  Filled 2020-09-15: qty 200

## 2020-09-15 MED ORDER — FENTANYL CITRATE (PF) 250 MCG/5ML IJ SOLN
INTRAMUSCULAR | Status: AC
  Filled 2020-09-15: qty 5

## 2020-09-15 MED ORDER — GABAPENTIN 100 MG PO CAPS
200.0000 mg | ORAL_CAPSULE | ORAL | Status: AC
  Administered 2020-09-15: 200 mg via ORAL

## 2020-09-15 MED ORDER — PROPOFOL 10 MG/ML IV BOLUS
INTRAVENOUS | Status: AC
  Filled 2020-09-15: qty 20

## 2020-09-15 MED ORDER — PHENYLEPHRINE 40 MCG/ML (10ML) SYRINGE FOR IV PUSH (FOR BLOOD PRESSURE SUPPORT)
PREFILLED_SYRINGE | INTRAVENOUS | Status: DC | PRN
  Administered 2020-09-15 (×2): 80 ug via INTRAVENOUS

## 2020-09-15 MED ORDER — ONDANSETRON HCL 4 MG/2ML IJ SOLN
INTRAMUSCULAR | Status: AC
  Filled 2020-09-15: qty 2

## 2020-09-15 MED ORDER — KETOROLAC TROMETHAMINE 30 MG/ML IJ SOLN
INTRAMUSCULAR | Status: DC | PRN
  Administered 2020-09-15: 30 mg via INTRAVENOUS

## 2020-09-15 MED ORDER — SUGAMMADEX SODIUM 200 MG/2ML IV SOLN
INTRAVENOUS | Status: DC | PRN
  Administered 2020-09-15: 200 mg via INTRAVENOUS
  Administered 2020-09-15 (×2): 100 mg via INTRAVENOUS

## 2020-09-15 MED ORDER — MIDAZOLAM HCL 5 MG/5ML IJ SOLN
INTRAMUSCULAR | Status: DC | PRN
  Administered 2020-09-15 (×2): 1 mg via INTRAVENOUS

## 2020-09-15 MED ORDER — CIPROFLOXACIN IN D5W 400 MG/200ML IV SOLN
400.0000 mg | INTRAVENOUS | Status: AC
  Administered 2020-09-15: 400 mg via INTRAVENOUS

## 2020-09-15 SURGICAL SUPPLY — 53 items
ADH SKN CLS APL DERMABOND .7 (GAUZE/BANDAGES/DRESSINGS) ×1
COVER BACK TABLE 60X90IN (DRAPES) ×2 IMPLANT
COVER TIP SHEARS 8 DVNC (MISCELLANEOUS) ×1 IMPLANT
COVER TIP SHEARS 8MM DA VINCI (MISCELLANEOUS) ×1
COVER WAND RF STERILE (DRAPES) ×2 IMPLANT
DECANTER SPIKE VIAL GLASS SM (MISCELLANEOUS) ×2 IMPLANT
DERMABOND ADVANCED (GAUZE/BANDAGES/DRESSINGS) ×1
DERMABOND ADVANCED .7 DNX12 (GAUZE/BANDAGES/DRESSINGS) ×1 IMPLANT
DRAPE ARM DVNC X/XI (DISPOSABLE) ×4 IMPLANT
DRAPE COLUMN DVNC XI (DISPOSABLE) ×1 IMPLANT
DRAPE DA VINCI XI ARM (DISPOSABLE) ×4
DRAPE DA VINCI XI COLUMN (DISPOSABLE) ×1
DRAPE SHEET LG 3/4 BI-LAMINATE (DRAPES) ×2 IMPLANT
DRAPE SURG IRRIG POUCH 19X23 (DRAPES) ×2 IMPLANT
DRSG TEGADERM 2-3/8X2-3/4 SM (GAUZE/BANDAGES/DRESSINGS) ×2 IMPLANT
ELECT REM PT RETURN 9FT ADLT (ELECTROSURGICAL) ×2
ELECTRODE REM PT RTRN 9FT ADLT (ELECTROSURGICAL) ×1 IMPLANT
GAUZE 4X4 16PLY RFD (DISPOSABLE) ×2 IMPLANT
GLOVE SURG ENC MOIS LTX SZ6 (GLOVE) ×8 IMPLANT
GLOVE SURG ENC MOIS LTX SZ6.5 (GLOVE) ×4 IMPLANT
GLOVE SURG UNDER POLY LF SZ7 (GLOVE) ×12 IMPLANT
IRRIG SUCT STRYKERFLOW 2 WTIP (MISCELLANEOUS) ×2
IRRIGATION SUCT STRKRFLW 2 WTP (MISCELLANEOUS) ×1 IMPLANT
KIT PROCEDURE DA VINCI SI (MISCELLANEOUS) ×1
KIT PROCEDURE DVNC SI (MISCELLANEOUS) ×1 IMPLANT
KIT TURNOVER CYSTO (KITS) ×2 IMPLANT
LEGGING LITHOTOMY PAIR STRL (DRAPES) ×2 IMPLANT
MANIFOLD NEPTUNE II (INSTRUMENTS) ×2 IMPLANT
MANIPULATOR UTERINE 4.5 ZUMI (MISCELLANEOUS) ×2 IMPLANT
NEEDLE HYPO 22GX1.5 SAFETY (NEEDLE) ×2 IMPLANT
NEEDLE SPNL 18GX3.5 QUINCKE PK (NEEDLE) ×2 IMPLANT
OBTURATOR OPTICAL STANDARD 8MM (TROCAR) ×1
OBTURATOR OPTICAL STND 8 DVNC (TROCAR) ×1
OBTURATOR OPTICALSTD 8 DVNC (TROCAR) ×1 IMPLANT
PACK ROBOT GYN CUSTOM WL (TRAY / TRAY PROCEDURE) ×2 IMPLANT
PACK ROBOTIC GOWN (GOWN DISPOSABLE) ×2 IMPLANT
PAD POSITIONING PINK XL (MISCELLANEOUS) ×2 IMPLANT
PORT ACCESS TROCAR AIRSEAL 12 (TROCAR) ×1 IMPLANT
PORT ACCESS TROCAR AIRSEAL 5M (TROCAR) ×1
SEAL CANN UNIV 5-8 DVNC XI (MISCELLANEOUS) ×4 IMPLANT
SEAL XI 5MM-8MM UNIVERSAL (MISCELLANEOUS) ×4
SET TRI-LUMEN FLTR TB AIRSEAL (TUBING) ×2 IMPLANT
SPONGE GAUZE 2X2 8PLY STRL LF (GAUZE/BANDAGES/DRESSINGS) ×2 IMPLANT
SUT VIC AB 4-0 PS2 18 (SUTURE) ×4 IMPLANT
SUT VLOC 180 0 9IN  GS21 (SUTURE) ×1
SUT VLOC 180 0 9IN GS21 (SUTURE) ×1 IMPLANT
SYR 10ML LL (SYRINGE) ×2 IMPLANT
TRAP SPECIMEN MUCUS 40CC (MISCELLANEOUS) ×2 IMPLANT
TRAY FOL W/BAG SLVR 16FR STRL (SET/KITS/TRAYS/PACK) ×1 IMPLANT
TRAY FOLEY W/BAG SLVR 16FR LF (SET/KITS/TRAYS/PACK) ×2
TUBE CONNECTING 12X1/4 (SUCTIONS) ×2 IMPLANT
UNDERPAD 30X36 HEAVY ABSORB (UNDERPADS AND DIAPERS) ×2 IMPLANT
WATER STERILE IRR 1000ML POUR (IV SOLUTION) ×2 IMPLANT

## 2020-09-15 NOTE — Op Note (Signed)
OPERATIVE NOTE 09/15/20  Surgeon: Donaciano Eva   Assistants: Joylene John, NP (a provider assistant was necessary for tissue manipulation, management of robotic instrumentation, retraction and positioning due to the complexity of the case and hospital policies).   Anesthesia: General endotracheal anesthesia  ASA Class: 3   Pre-operative Diagnosis: postmenopausal bleeding, cervical mass, atypical cells on pap smear  Post-operative Diagnosis: same and right adnexal mass  Operation: Robotic-assisted laparoscopic total hysterectomy with bilateral salpingoophorectomy   Surgeon: Donaciano Eva  Assistant Surgeon: Joylene John NP  Anesthesia: GET  Urine Output: 25cc  Operative Findings:  :  Cervix flush with vagina. Normal looking uterus, surgically absent right ovary but there was a 5cm cystic mass in the right ovarian fossa. Surgically absent appendix. Normal looking left ovary. Frozen section revealed benign findings at cervix, endometrium and right ovary.  Estimated Blood Loss:  less than 50 mL      Total IV Fluids: 700 ml         Specimens: pelvic washings, uterus with cervix and bilateral ovaries and fallopian tubes.          Complications:  None; patient tolerated the procedure well.         Disposition: PACU - hemodynamically stable.  Procedure Details  The patient was seen in the Holding Room. The risks, benefits, complications, treatment options, and expected outcomes were discussed with the patient.  The patient concurred with the proposed plan, giving informed consent.  The site of surgery properly noted/marked. The patient was identified as [employer] and the procedure verified as a Robotic-assisted hysterectomy with bilateral salpingo oophorectomy. A Time Out was held and the above information confirmed.  After induction of anesthesia, the patient was draped and prepped in the usual sterile manner. Pt was placed in supine position after anesthesia  and draped and prepped in the usual sterile manner. The abdominal drape was placed after the CholoraPrep had been allowed to dry for 3 minutes.  Her arms were tucked to her side with all appropriate precautions.  The shoulders were stabilized with padded shoulder blocks applied to the acromium processes.  The patient was placed in the semi-lithotomy position in [place].  The perineum was prepped with Betadine. The patient was then prepped. Foley catheter was placed.  1cc of ICG was injected into the cervical stroma at 3 and 9 o'clock at a 51mm depth and 28mm depth with a total of 4cc injected (ICG concentration 0.5mg /ml). This was performed in case staging was necessary after frozen section was resulted from the endometrium and cervix.  A uterine manipulator could not be placed due to the flush cervix with the vagina. Therefore an EEA sizer was used for vaginal delineation. An OG tube placement was confirmed and to suction.   Next, a 5 mm skin incision was made 1 cm below the subcostal margin in the midclavicular line.  The 5 mm Optiview port and scope was used for direct entry.  Opening pressure was under 10 mm CO2.  The abdomen was insufflated and the findings were noted as above.   At this point and all points during the procedure, the patient's intra-abdominal pressure did not exceed 15 mmHg. Next, a 10 mm skin incision was made in the umbilicus and a right and left port was placed about 10 cm lateral to the robot port on the right and left side.  A fourth arm was placed in the left lower quadrant 2 cm above and superior and medial to the  anterior superior iliac spine.  All ports were placed under direct visualization.  The patient was placed in steep Trendelenburg.  Bowel was folded away into the upper abdomen.  The robot was docked in the normal manner.  The hysterectomy was started after the round ligament on the right side was incised and the retroperitoneum was entered and the pararectal space  was developed.  The ureter was noted to be on the medial leaf of the broad ligament.  The peritoneum above the ureter was incised and stretched and the residual infundibulopelvic ligament was skeletonized, cauterized and cut.  The posterior peritoneum was taken down and the rectovaginal septum was created with sharp dissection to drop the rectum off of the posterior upper vaginal wall.  The anterior peritoneum was also taken down.  The bladder flap was created to 5cm beyond the tip of the EEA sizer in the vagina.  The uterine artery on the right side was skeletonized, cauterized and cut in the normal manner.  A similar procedure was performed on the left.  The colpotomy was made and the uterus, cervix, bilateral ovaries and tubes were amputated and delivered through the vagina.  Pedicles were inspected and excellent hemostasis was achieved.    The colpotomy at the vaginal cuff was closed with Vicryl on a CT1 needle in a figure of 8 fashion at the corners, then 0 v-lock running closure in a 2 layer closure.  Irrigation was used and excellent hemostasis was achieved.  At this point in the procedure was completed when frozen section confirmed hemostasis at all sites.  Robotic instruments were removed under direct visulaization.  The robot was undocked. The 10 mm ports were closed with Vicryl on a UR-5 needle and the fascia was closed with 0 Vicryl on a UR-5 needle.  The skin was closed with 4-0 Vicryl in a subcuticular manner.  Dermabond was applied.  Sponge, lap and needle counts correct x 2.  The patient was taken to the recovery room in stable condition.  The vagina was swabbed with  minimal bleeding noted.   All instrument and needle counts were correct x  3.   The patient was transferred to the recovery room in a stable condition.  Donaciano Eva, MD

## 2020-09-15 NOTE — Discharge Instructions (Signed)
AFTER SURGERY INSTRUCTIONS  Return to work: 4 weeks if applicable  You can remove the dressing over the incision on your left side tomorrow after you have taken a shower.  On frozen section during the surgery, the pathologist felt everything that was removed was benign (not cancer). We will contact you with the final pathology report.  Activity: 1. Be up and out of the bed during the day.  Take a nap if needed.  You may walk up steps but be careful and use the hand rail.  Stair climbing will tire you more than you think, you may need to stop part way and rest.   2. No lifting or straining for 4-6 weeks over 10 pounds. No pushing, pulling, straining for 6 weeks.  3. No driving for 1 week(s).  Do not drive if you are taking narcotic pain medicine and make sure that your reaction time has returned.   4. You can shower as soon as the next day after surgery. Shower daily.  Use your regular soap and water (not directly on the incision) and pat your incision(s) dry afterwards; don't rub.  No tub baths or submerging your body in water until cleared by your surgeon. If you have the soap that was given to you by pre-surgical testing that was used before surgery, you do not need to use it afterwards because this can irritate your incisions.   5. No sexual activity and nothing in the vagina for 8 weeks.  6. You may experience a small amount of clear drainage from your incisions, which is normal.  If the drainage persists, increases, or changes color please call the office.  7. Do not use creams, lotions, or ointments such as neosporin on your incisions after surgery until advised by your surgeon because they can cause removal of the dermabond glue on your incisions.    8. You may experience vaginal spotting after surgery or around the 6-8 week mark from surgery when the stitches at the top of the vagina begin to dissolve.  The spotting is normal but if you experience heavy bleeding, call our office.  9.  Take Tylenol or ibuprofen first for pain and only use narcotic pain medication for severe pain not relieved by the Tylenol or Ibuprofen.  Monitor your Tylenol intake to a max of 4,000 mg in a 24 hour period. You can alternate these medications after surgery.  Diet: 1. Low sodium Heart Healthy Diet is recommended but you are cleared to resume your normal (before surgery) diet after your procedure.  2. It is safe to use a laxative, such as Miralax or Colace, if you have difficulty moving your bowels. You have been prescribed Sennakot at bedtime every evening to keep bowel movements regular and to prevent constipation.    Wound Care: 1. Keep clean and dry.  Shower daily.  Reasons to call the Doctor:  Fever - Oral temperature greater than 100.4 degrees Fahrenheit  Foul-smelling vaginal discharge  Difficulty urinating  Nausea and vomiting  Increased pain at the site of the incision that is unrelieved with pain medicine.  Difficulty breathing with or without chest pain  New calf pain especially if only on one side  Sudden, continuing increased vaginal bleeding with or without clots.   Contacts: For questions or concerns you should contact:  Dr. Everitt Amber at [phone]  Joylene John, NP at [phone]  After Hours: call [phone] and have the GYN Oncologist paged/contacted (after 5 pm or on the weekends).  Messages  sent via mychart are for non-urgent matters and are not responded to after hours so for urgent needs, please call the after hours number.    Total Laparoscopic Hysterectomy, Care After The following information offers guidance on how to care for yourself after your procedure. Your health care provider may also give you more specific instructions. If you have problems or questions, contact your health care provider. What can I expect after the procedure? After the procedure, it is common to have:  Pain, bruising, and numbness around your  incisions.  Tiredness (fatigue).  Poor appetite.  Less interest in sex.  Vaginal discharge or bleeding. You will need to use a sanitary pad after this procedure.  Feelings of sadness or other emotions. If your ovaries were also removed, it is also common to have symptoms of menopause, such as hot flashes, night sweats, and lack of sleep (insomnia). Follow these instructions at home: Medicines  Take over-the-counter and prescription medicines only as told by your health care provider.  Ask your health care provider if the medicine prescribed to you: ? Requires you to avoid driving or using machinery. ? Can cause constipation. You may need to take these actions to prevent or treat constipation:  Drink enough fluid to keep your urine pale yellow.  Take over-the-counter or prescription medicines.  Eat foods that are high in fiber, such as beans, whole grains, and fresh fruits and vegetables.  Limit foods that are high in fat and processed sugars, such as fried or sweet foods. Incision care  Follow instructions from your health care provider about how to take care of your incisions. Make sure you: ? Wash your hands with soap and water for at least 20 seconds before and after you change your bandage (dressing). If soap and water are not available, use hand sanitizer. ? Change your dressing as told by your health care provider. ? Leave stitches (sutures), skin glue, or adhesive strips in place. These skin closures may need to stay in place for 2 weeks or longer. If adhesive strip edges start to loosen and curl up, you may trim the loose edges. Do not remove adhesive strips completely unless your health care provider tells you to do that.  Check your incision areas every day for signs of infection. Check for: ? More redness, swelling, or pain. ? Fluid or blood. ? Warmth. ? Pus or a bad smell.   Activity  Rest as told by your health care provider.  Avoid sitting for a long time  without moving. Get up to take short walks every 1-2 hours. This is important to improve blood flow and breathing. Ask for help if you feel weak or unsteady.  Return to your normal activities as told by your health care provider. Ask your health care provider what activities are safe for you.  Do not lift anything that is heavier than 10 lb (4.5 kg), or the limit that you are told, for one month after surgery or until your health care provider says that it is safe.  If you were given a sedative during the procedure, it can affect you for several hours. Do not drive or operate machinery until your health care provider says that it is safe.   Lifestyle  Do not use any products that contain nicotine or tobacco. These products include cigarettes, chewing tobacco, and vaping devices, such as e-cigarettes. These can delay healing after surgery. If you need help quitting, ask your health care provider.  Do not drink alcohol  until your health care provider approves. General instructions  Do not douche, use tampons, or have sex for at least 6 weeks, or as told by your health care provider.  If you struggle with physical or emotional changes after your procedure, speak with your health care provider or a therapist.  Do not take baths, swim, or use a hot tub until your health care provider approves. You may only be allowed to take showers for 2-3 weeks.  Keep your dressing dry until your health care provider says it can be removed.  Try to have someone at home with you for the first 1-2 weeks to help with your daily chores.  Wear compression stockings as told by your health care provider. These stockings help to prevent blood clots and reduce swelling in your legs.  Keep all follow-up visits. This is important.   Contact a health care provider if:  You have any of these signs of infection: ? Chills or a fever. ? More redness, swelling, or pain around an incision. ? Fluid or blood coming from an  incision. ? Warmth coming from an incision. ? Pus or a bad smell coming from an incision.  An incision opens.  You feel dizzy or light-headed.  You have pain or bleeding when you urinate, or you are unable to urinate.  You have abnormal vaginal discharge.  You have pain that does not get better with medicine. Get help right away if:  You have a fever and your symptoms suddenly get worse.  You have severe abdominal pain.  You have chest pain or shortness of breath.  You faint.  You have pain, swelling, or redness in your leg.  You have heavy vaginal bleeding with blood clots, soaking through a sanitary pad in less than 1 hour. These symptoms may represent a serious problem that is an emergency. Do not wait to see if the symptoms will go away. Get medical help right away. Call your local emergency services (911 in the [place] Do not drive yourself to the hospital. Summary  After the procedure, it is common to have pain and bruising around your incisions.  Do not take baths, swim, or use a hot tub until your health care provider approves.  Do not lift anything that is heavier than 10 lb (4.5 kg), or the limit that you are told, for one month after surgery or until your health care provider says that it is safe.  Tell your health care provider if you have any signs or symptoms of infection after the procedure.  Get help right away if you have severe abdominal pain, chest pain, shortness of breath, or heavy bleeding from your vagina. This information is not intended to replace advice given to you by your health care provider. Make sure you discuss any questions you have with your health care provider. Document Revised: 12/28/2019 Document Reviewed: 12/28/2019 Elsevier Patient Education  2021 [place] Instructions  Activity: Get plenty of rest for the remainder of the day. A responsible individual must stay with you for 24 hours following the  procedure.  For the next 24 hours, DO NOT: -Drive a car -Paediatric nurse -Drink alcoholic beverages -Take any medication unless instructed by your physician -Make any legal decisions or sign important papers.  Meals: Start with liquid foods such as gelatin or soup. Progress to regular foods as tolerated. Avoid greasy, spicy, heavy foods. If nausea and/or vomiting occur, drink only clear liquids until the  nausea and/or vomiting subsides. Call your physician if vomiting continues.  Special Instructions/Symptoms: Your throat may feel dry or sore from the anesthesia or the breathing tube placed in your throat during surgery. If this causes discomfort, gargle with warm salt water. The discomfort should disappear within 24 hours.  If you had a scopolamine patch placed behind your ear for the management of post- operative nausea and/or vomiting:  1. The medication in the patch is effective for 72 hours, after which it should be removed.  Wrap patch in a tissue and discard in the trash. Wash hands thoroughly with soap and water. 2. You may remove the patch earlier than 72 hours if you experience unpleasant side effects which may include dry mouth, dizziness or visual disturbances. 3. Avoid touching the patch. Wash your hands with soap and water after contact with the patch.

## 2020-09-15 NOTE — Anesthesia Procedure Notes (Addendum)
Procedure Name: Intubation Date/Time: 09/15/2020 8:29 AM Performed by: Rogers Blocker, CRNA Pre-anesthesia Checklist: Patient identified, Emergency Drugs available, Suction available and Patient being monitored Patient Re-evaluated:Patient Re-evaluated prior to induction Oxygen Delivery Method: Circle System Utilized Preoxygenation: Pre-oxygenation with 100% oxygen Induction Type: IV induction Ventilation: Mask ventilation without difficulty Laryngoscope Size: Glidescope and 3 Grade View: Grade I Tube type: Oral Tube size: 7.0 mm Number of attempts: 2 Airway Equipment and Method: Rigid stylet and Video-laryngoscopy Placement Confirmation: ETT inserted through vocal cords under direct vision,  positive ETCO2 and breath sounds checked- equal and bilateral Secured at: 21 cm Tube secured with: Tape Dental Injury: Injury to lip  Difficulty Due To: Difficulty was anticipated, Difficult Airway- due to limited oral opening and Difficult Airway- due to anterior larynx Comments: Attempt x1 with Mac 3, Gr 2 view. GlydeScope 3.0 used with Gr 1 view with cricoid pressure, screw ETT to advance. Small nick to L upper lip, lubricant applied.

## 2020-09-15 NOTE — Interval H&P Note (Signed)
History and Physical Interval Note:  09/15/2020 8:03 AM  Sheila Haynes  has presented today for surgery, with the diagnosis of CERVICAL MASS,POST MENOPAUSAL BLEEDING.  The various methods of treatment have been discussed with the patient and family. After consideration of risks, benefits and other options for treatment, the patient has consented to  Procedure(s): XI ROBOTIC ASSISTED TOTAL HYSTERECTOMY WITH BILATERAL SALPINGO OOPHORECTOMY POSSIBLE STAGING (Bilateral) as a surgical intervention.  The patient's history has been reviewed, patient examined, no change in status, stable for surgery.  I have reviewed the patient's chart and labs.  Questions were answered to the patient's satisfaction.     [name]

## 2020-09-15 NOTE — Anesthesia Postprocedure Evaluation (Signed)
Anesthesia Post Note  Patient: Jyrah Blye  Procedure(s) Performed: XI ROBOTIC ASSISTED TOTAL HYSTERECTOMY WITH BILATERAL SALPINGO OOPHORECTOMY, PELVIC WASHING (Bilateral Abdomen)     Patient location during evaluation: PACU Anesthesia Type: General Level of consciousness: awake and alert and oriented Pain management: pain level controlled Vital Signs Assessment: post-procedure vital signs reviewed and stable Respiratory status: spontaneous breathing, nonlabored ventilation and respiratory function stable Cardiovascular status: blood pressure returned to baseline and stable Postop Assessment: no apparent nausea or vomiting Anesthetic complications: no   No complications documented.  Last Vitals:  Vitals:   09/15/20 1200 09/15/20 1230  BP: (!) 161/76 (!) 157/77  Pulse: 69 (!) 59  Resp: 16 15  Temp:    SpO2: 95% 94%    Last Pain:  Vitals:   09/15/20 1200  TempSrc:   PainSc: 0-No pain                 Verle Brillhart A.

## 2020-09-15 NOTE — Transfer of Care (Signed)
Immediate Anesthesia Transfer of Care Note  Patient: Sheila Haynes  Procedure(s) Performed: XI ROBOTIC ASSISTED TOTAL HYSTERECTOMY WITH BILATERAL SALPINGO OOPHORECTOMY, PELVIC WASHING (Bilateral Abdomen)  Patient Location: PACU  Anesthesia Type:General  Level of Consciousness: sedated and responds to stimulation  Airway & Oxygen Therapy: Patient Spontanous Breathing and Patient connected to face mask oxygen  Post-op Assessment: Report given to RN and Post -op Vital signs reviewed and stable  Post vital signs: Reviewed and stable  Last Vitals:  Vitals Value Taken Time  BP 141/80 09/15/20 1048  Temp    Pulse 70 09/15/20 1051  Resp 15 09/15/20 1051  SpO2 99 % 09/15/20 1051  Vitals shown include unvalidated device data.  Last Pain:  Vitals:   09/15/20 0643  TempSrc: Oral  PainSc: 0-No pain      Patients Stated Pain Goal: 5 (65/[id number])  Complications: No complications documented.

## 2020-09-16 ENCOUNTER — Encounter (HOSPITAL_BASED_OUTPATIENT_CLINIC_OR_DEPARTMENT_OTHER): Payer: Self-pay | Admitting: Gynecologic Oncology

## 2020-09-16 ENCOUNTER — Telehealth: Payer: Self-pay

## 2020-09-16 LAB — SURGICAL PATHOLOGY

## 2020-09-16 LAB — CYTOLOGY - NON PAP

## 2020-09-16 NOTE — Telephone Encounter (Addendum)
Ms Audi states that she is eating,drinking, and urinating well. Passing gas. Instructed her to increase the senokot-s to 2 tabs with 8 oz of water bid .  If no good BM by mid day Thursday 09-18-2020, she can add a capful of miralax bid. Afebrile. Incisions D&I She states that she has a rash from her neck to her knees.  It is not raised or itchy. Rash is red.  No difficulty breathing.  Reviewed with Melissa Cross,NP.  The chlorhexidene used in surgery can look like a spray tan. She can use benadryl 25 mg if the rash becomes itchy.  She is to call the office if she has any further issues with the rash. Pt aware of post op appointments as well as the office number [phone] and after hours number [phone] to call if she has any questions or concerns

## 2020-09-17 ENCOUNTER — Telehealth: Payer: Self-pay

## 2020-09-17 NOTE — Telephone Encounter (Signed)
Told Ms Callins that the final pathology showed no cancer. Per Melissa Cross,NP. Pt very pleased.

## 2020-09-19 ENCOUNTER — Telehealth: Payer: Self-pay | Admitting: *Deleted

## 2020-09-19 NOTE — Telephone Encounter (Signed)
Patient called and left a message stating "I had surgery on Monday, and I have no issue. I started with a running yesterday and today, so I took a home COVID test and it was positive. I wanted to make sure it wouldn't cause any issue with the surgery." Called the patient back and explained per [facility] APP "that the positive test will not cause any issues with her surgery. If you feel like you need more testing or treatment to call your PCP." patient verbalized understanding

## 2020-10-10 ENCOUNTER — Inpatient Hospital Stay: Payer: Managed Care, Other (non HMO)

## 2020-10-10 ENCOUNTER — Encounter: Payer: Self-pay | Admitting: Gynecologic Oncology

## 2020-10-10 ENCOUNTER — Inpatient Hospital Stay: Payer: Managed Care, Other (non HMO) | Attending: Gynecologic Oncology | Admitting: Gynecologic Oncology

## 2020-10-10 ENCOUNTER — Other Ambulatory Visit: Payer: Self-pay

## 2020-10-10 VITALS — BP 139/68 | HR 76 | Temp 97.6°F | Resp 18 | Ht 59.0 in | Wt 125.8 lb

## 2020-10-10 DIAGNOSIS — N888 Other specified noninflammatory disorders of cervix uteri: Secondary | ICD-10-CM | POA: Insufficient documentation

## 2020-10-10 DIAGNOSIS — Z90710 Acquired absence of both cervix and uterus: Secondary | ICD-10-CM | POA: Diagnosis not present

## 2020-10-10 DIAGNOSIS — R310 Gross hematuria: Secondary | ICD-10-CM

## 2020-10-10 DIAGNOSIS — N950 Postmenopausal bleeding: Secondary | ICD-10-CM | POA: Insufficient documentation

## 2020-10-10 DIAGNOSIS — N3945 Continuous leakage: Secondary | ICD-10-CM | POA: Diagnosis not present

## 2020-10-10 DIAGNOSIS — R87619 Unspecified abnormal cytological findings in specimens from cervix uteri: Secondary | ICD-10-CM | POA: Insufficient documentation

## 2020-10-10 DIAGNOSIS — N820 Vesicovaginal fistula: Secondary | ICD-10-CM

## 2020-10-10 DIAGNOSIS — Z90722 Acquired absence of ovaries, bilateral: Secondary | ICD-10-CM | POA: Insufficient documentation

## 2020-10-10 NOTE — Patient Instructions (Addendum)
Dr Denman George is concerned that a hole has developed between your bladder and your vagina. The catheter should drain most of your urine diverting it from the vagina. She is testing the fluid from your vagina to see if this is urine. She is ordering scans to check to see if the hole is from the bladder or from the ureter (the tube that drains the kidney).  If this is a bladder hole, treatment is typically with a catheter in the bladder for 3-6 weeks. If this is a hold in the ureter, it may require a surgical intervention (such as a stent placed in the ureter, or a surgery to re-implant the kidney tube into the bladder).   Dr Denman George will contact you immediately when she has these results.   WE WILL CALL YOU FIRST THING ON Monday TO SCHEDULE YOUR CT SCAN.   Indwelling Urinary Catheter Care, Adult  An indwelling urinary catheter is a thin, flexible, germ-free (sterile) tube that is placed into the bladder to help drain urine out of the body. The catheter is inserted into the part of the body that drains urine from the bladder (urethra). Urine drains from the catheter into a drainage bag outside of the body. Taking good care of your catheter will keep it working properly and help to prevent problems from developing. What are the risks?  Bacteria may get into your bladder and cause a urinary tract infection.  Urine flow can become blocked. This can happen if the catheter is not working correctly, or if you have sediment or a blood clot in your bladder or the catheter.  Tissue near the catheter may become irritated and bleed. How to wear your catheter and your drainage bag Supplies needed  Adhesive tape or a leg strap.  Alcohol wipe or soap and water (if you use tape).  A clean towel (if you use tape).  Overnight drainage bag.  Smaller drainage bag (leg bag). Wearing your catheter and bag Use adhesive tape or a leg strap to attach your catheter to your leg.  Make sure the catheter is not  pulled tight.  If a leg strap gets wet, replace it with a dry one.  If you use adhesive tape: 1. Use an alcohol wipe or soap and water to wash off any stickiness on your skin where you had tape before. 2. Use a clean towel to pat-dry the area. 3. Apply the new tape. You should have received a large overnight drainage bag and a smaller leg bag that fits underneath clothing.  You may wear the overnight bag at any time, but you should not wear the leg bag at night.  Always wear the leg bag below your knee.  Make sure the overnight drainage bag is always lower than the level of your bladder, but do not let it touch the floor. Before you go to sleep, hang the bag inside a wastebasket that is covered by a clean plastic bag. How to care for your skin around the catheter Supplies needed  A clean washcloth.  Water and mild soap.  A clean towel. Caring for your skin and catheter  Every day, use a clean washcloth and soapy water to clean the skin around your catheter. 1. Wash your hands with soap and water. 2. Wet a washcloth in warm water and mild soap. 3. Clean the skin around your urethra.  If you are female:  Use one hand to gently spread the folds of skin around your vagina (labia).  With the washcloth in your other hand, wipe the inner side of your labia on each side. Do this in a front-to-back direction.  If you are female:  Use one hand to pull back any skin that covers the end of your penis (foreskin).  With the washcloth in your other hand, wipe your penis in small circles. Start wiping at the tip of your penis, then move outward from the catheter.  Move the foreskin back in place, if this applies. 4. With your free hand, hold the catheter close to where it enters your body. Keep holding the catheter during cleaning so it does not get pulled out. 5. Use your other hand to clean the catheter with the washcloth.  Only wipe downward on the catheter.  Do not wipe upward  toward your body, because that may push bacteria into your urethra and cause infection. 6. Use a clean towel to pat-dry the catheter and the skin around it. Make sure to wipe off all soap. 7. Wash your hands with soap and water.  Shower every day. Do not take baths.  Do not use cream, ointment, or lotion on the area where the catheter enters your body, unless your health care provider tells you to do that.  Do not use powders, sprays, or lotions on your genital area.  Check your skin around the catheter every day for signs of infection. Check for: ? Redness, swelling, or pain. ? Fluid or blood. ? Warmth. ? Pus or a bad smell.      How to empty the drainage bag Supplies needed  Rubbing alcohol.  Gauze pad or cotton ball.  Adhesive tape or a leg strap. Emptying the bag Empty your drainage bag (your overnight drainage bag or your leg bag) when it is ?- full, or at least 2-3 times a day. Clean the drainage bag according to the manufacturer's instructions or as told by your health care provider. 1. Wash your hands with soap and water. 2. Detach the drainage bag from your leg. 3. Hold the drainage bag over the toilet or a clean container. Make sure the drainage bag is lower than your hips and bladder. This stops urine from going back into the tubing and into your bladder. 4. Open the pour spout at the bottom of the bag. 5. Empty the urine into the toilet or container. Do not let the pour spout touch any surface. This precaution is important to prevent bacteria from getting in the bag and causing infection. 6. Apply rubbing alcohol to a gauze pad or cotton ball. 7. Use the gauze pad or cotton ball to clean the pour spout. 8. Close the pour spout. 9. Attach the bag to your leg with adhesive tape or a leg strap. 10. Wash your hands with soap and water. How to change the drainage bag Supplies needed:  Alcohol wipes.  A clean drainage bag.  Adhesive tape or a leg strap. Changing  the bag Replace your drainage bag with a clean bag if it leaks, starts to smell bad, or looks dirty. 1. Wash your hands with soap and water. 2. Detach the dirty drainage bag from your leg. 3. Pinch the catheter with your fingers so that urine does not spill out. 4. Disconnect the catheter tube from the drainage tube at the connection valve. Do not let the tubes touch any surface. 5. Clean the end of the catheter tube with an alcohol wipe. Use a different alcohol wipe to clean the end of the drainage  tube. 6. Connect the catheter tube to the drainage tube of the clean bag. 7. Attach the clean bag to your leg with adhesive tape or a leg strap. Avoid attaching the new bag too tightly. 8. Wash your hands with soap and water. General instructions  Never pull on your catheter or try to remove it. Pulling can damage your internal tissues.  Always wash your hands before and after you handle your catheter or drainage bag. Use a mild, fragrance-free soap. If soap and water are not available, use hand sanitizer.  Always make sure there are no twists or bends (kinks) in the catheter tube.  Always make sure there are no leaks in the catheter or drainage bag.  Drink enough fluid to keep your urine pale yellow.  Do not take baths, swim, or use a hot tub.  If you are female, wipe from front to back after having a bowel movement.   Contact a health care provider if:  Your urine is cloudy.  Your urine smells unusually bad.  Your catheter gets clogged.  Your catheter starts to leak.  Your bladder feels full. Get help right away if:  You have redness, swelling, or pain where the catheter enters your body.  You have fluid, blood, pus, or a bad smell coming from the area where the catheter enters your body.  The area where the catheter enters your body feels warm to the touch.  You have a fever.  You have pain in your abdomen, legs, lower back, or bladder.  You see blood in the  catheter.  Your urine is pink or red.  You have nausea, vomiting, or chills.  Your urine is not draining into the bag.  Your catheter gets pulled out. Summary  An indwelling urinary catheter is a thin, flexible, germ-free (sterile) tube that is placed into the bladder to help drain urine out of the body.  The catheter is inserted into the part of the body that drains urine from the bladder (urethra).  Take good care of your catheter to keep it working properly and help prevent problems from developing.  Always wash your hands before and after you handle your catheter or drainage bag.  Never pull on your catheter or try to remove it. This information is not intended to replace advice given to you by your health care provider. Make sure you discuss any questions you have with your health care provider. Document Revised: 07/02/2019 Document Reviewed: 12/10/2016 Elsevier Patient Education  [place].

## 2020-10-10 NOTE — Progress Notes (Signed)
Follow-up Note: Gyn-Onc  Consult was requested by Dr. Wilhelmenia Blase for the evaluation of Sheila Haynes 60 y.o. female  CC:  Chief Complaint  Patient presents with  . Cervical mass  . Postmenopausal bleeding    Assessment/Plan:  Sheila Haynes  is a 60 y.o.  year old with a history of robotic assisted total hysterectomy and BSO on 09/15/20 for postmenopausal bleeding, low grade/atypical cells on ECC, atrophic cervix and abnormal cervix on [place]. Pathology was benign at all sites.  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   We will send the fluid for creatinine (this is vaginal body fluid). If elevated ,this confirms a urinary tract fistula with the vagina. I have ordered a CT abdomen and pelvis with delayed contrast and a retrograde cystogram to evaluate for the source of fistula.  We have placed a foley catheter for symptom relief and, if a vesicovaginal fistula is present, this may resolve the injury.   We will see her back in the office for re-evaluation in approximately 1 week to assess for improvement of symptoms or to formulate an intervention plan.   HPI: Sheila Haynes is a 60year old P2 who was seen in consultation at the request of Dr Wilhelmenia Blase for evaluation of postmenopausal bleeding, atypical cervical cells on ECC, cervical mass on ultrasound.  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  She was referred to Dr. Wilhelmenia Blase for colposcopic evaluation approximately 6 months prior to diagnosis.  At that time colposcopy was not adequate due to atrophy.  She was prescribed vaginal estrogen which she took in March 2022.  On July 15, 2020, immediately after assuming use of vaginal estrogen, the patient developed a single episode of heavy vaginal bleeding.  She return to see Dr. Wilhelmenia Blase to further explore and work  this up on August 01, 2020.  An endocervical biopsy under ultrasound guidance was performed on 08/01/2020 and revealed fragmented squamous mucosa with atypia bordering of low-grade dysplasia.  No invasive neoplasm was identified.  A transvaginal ultrasound scan was performed that same day in the office and revealed a uterus measuring 10.1 x 5.5 x 4.4 cm with an endometrial thickness of 7.24 mm.  The notes of the ultrasound report a simple right adnexal cyst measuring approximately 5 cm.  There was a's heterogeneous material or mass with increased peripheral vascularity in the cervix that was appreciated on ultrasound.  The patient returned to the office on 08/06/2020 for an endometrial biopsy to follow-up the thickened endometrial stripe that had been seen on ultrasound.  This revealed benign squamous epithelium with no distinct endometrial tissue present on evaluation.  An MRI of the pelvis with and without contrast was performed on 08/29/2020 to work-up the abnormal cervical findings on ultrasound imaging.  This revealed a uterus measuring 7.6 x 4.5 x 5.4 cm with no fibroids or other masses identified.  The endometrium was thin and measured approximately 4 mm.  The cervix and vagina were unremarkable.  There was no evidence of a cervical mass.  The right ovary contained a simple appearing cyst measuring 4.6 x 3.5 cm.  The left ovary was not visualized.  There was no constellations of metastatic disease with no lymphadenopathy, free fluid, or carcinomatosis.  Interval Hx:  On 09/15/2020 she underwent robotic assisted total hysterectomy with BSO or. Intraoperative findings were significant for cervix flush with  the vagina.  Normal-looking uterus, surgically absent right ovary however there was a 5 cm cystic mass in the right ovarian fossa.  Surgically absent appendix.  Normal looking left tube and ovary.  Frozen section revealed benign findings at the cervix, endometrium, and right ovary. Surgery was  uncomplicated the required extensive bladder dissection due to absence of a uterine manipulator or Koh ring.  Final pathology revealed disordered proliferative endometrium, benign cervix with squamous metaplasia, benign simple cyst of the adnexa.  Since surgery she initially did well however on Oct 02, 2020 which was approximately 17 days postoperatively she suddenly developed leakage of clear fluid that was blood-tinged that she thought was coming from the bladder.  This was persistent through nocturnal and daytime events.  It was uncontrollable leakage.  She soaked through a poise pad every hour.  She was seen by her primary care provider who empirically treated her for presumed UTI with 2 different antibiotics without resolution of symptoms.  She presented to her postoperative appointment on 10/10/2020 reporting persistence of the symptoms.  She denied flank pain.  She denied abdominal pains.  She denied fever.  Current Meds:  Outpatient Encounter Medications as of 10/10/2020  Medication Sig  . ciprofloxacin (CIPRO) 500 MG tablet Take 500 mg by mouth 2 (two) times daily.  . multivitamin-lutein (OCUVITE-LUTEIN) CAPS capsule Take 1 capsule by mouth daily. (Patient not taking: Reported on 10/10/2020)  . Omega-3 Fatty Acids (FISH OIL) 1200 MG CAPS Take 1,200 mg by mouth daily. (Patient not taking: Reported on 10/10/2020)  . [DISCONTINUED] ibuprofen (ADVIL) 800 MG tablet Take 1 tablet (800 mg total) by mouth every 8 (eight) hours as needed for moderate pain. For AFTER surgery only  . [DISCONTINUED] senna-docusate (SENOKOT-S) 8.6-50 MG tablet Take 2 tablets by mouth at bedtime. For AFTER surgery, do not take if having diarrhea  . [DISCONTINUED] traMADol (ULTRAM) 50 MG tablet Take 1 tablet (50 mg total) by mouth every 6 (six) hours as needed for severe pain. For AFTER surgery, do not take and drive   No facility-administered encounter medications on file as of 10/10/2020.    Allergy:  Allergies  Allergen  Reactions  . Cephalexin Rash  . Hydromorphone Hcl Other (See Comments)    Severe Headache  . Penicillins Rash  . Sulfa Antibiotics Rash  . Oxaprozin   . Trimethoprim     Social Hx:   Social History   Socioeconomic History  . Marital status: Married    Spouse name: Not on file  . Number of children: Not on file  . Years of education: Not on file  . Highest education level: Not on file  Occupational History  . Not on file  Tobacco Use  . Smoking status: Never Smoker  . Smokeless tobacco: Never Used  Vaping Use  . Vaping Use: Never used  Substance and Sexual Activity  . Alcohol use: Never  . Drug use: Not Currently  . Sexual activity: Yes    Birth control/protection: Post-menopausal  Other Topics Concern  . Not on file  Social History Narrative  . Not on file   Social Determinants of Health   Financial Resource Strain: Not on file  Food Insecurity: Not on file  Transportation Needs: Not on file  Physical Activity: Not on file  Stress: Not on file  Social Connections: Not on file  Intimate Partner Violence: Not on file    Past Surgical Hx:  Past Surgical History:  Procedure Laterality Date  . APPENDECTOMY  1999  .  CESAREAN SECTION  06/25/1991  . Cesaren section  1988  . OOPHORECTOMY Right 09/1997  . ROBOTIC ASSISTED TOTAL HYSTERECTOMY WITH BILATERAL SALPINGO OOPHERECTOMY Bilateral 09/15/2020   Procedure: XI ROBOTIC ASSISTED TOTAL HYSTERECTOMY WITH BILATERAL SALPINGO OOPHORECTOMY, PELVIC WASHING;  Surgeon: Everitt Amber, MD;  Location: [facility];  Service: Gynecology;  Laterality: Bilateral;    Past Medical Hx:  Past Medical History:  Diagnosis Date  . Atypical squamous cell changes of undetermined significance (ASCUS) on cervical cytology with negative high risk human papilloma virus (HPV) test result   . Wears glasses     Past Gynecological History:  C/s x 2, s/p RSO in 1999 Patient's last menstrual period was 05/10/1997.  Family Hx:   Family History  Problem Relation Age of Onset  . Uterine cancer Mother   . Multiple myeloma Mother   . Hypertension Father   . Heart disease Father   . Diabetes Father   . Stomach cancer Maternal Grandmother   . Brain cancer Maternal Grandfather   . Uterine cancer Paternal Grandmother   . Pancreatic cancer Paternal Uncle   . Pancreatic cancer Paternal Uncle   . Colon cancer Neg Hx   . Ovarian cancer Neg Hx   . Breast cancer Neg Hx   . Prostate cancer Neg Hx     Review of Systems:  Constitutional  Feels well,    ENT Normal appearing ears and nares bilaterally Skin/Breast  No rash, sores, jaundice, itching, dryness Cardiovascular  No chest pain, shortness of breath, or edema  Pulmonary  No cough or wheeze.  Gastro Intestinal  No nausea, vomitting, or diarrhoea. No bright red blood per rectum, no abdominal pain, change in bowel movement, or constipation.  Genito Urinary  + leakage of fluid from the vagina. Musculo Skeletal  No myalgia, arthralgia, joint swelling or pain  Neurologic  No weakness, numbness, change in gait,  Psychology  No depression, anxiety, insomnia.   Vitals:  Blood pressure 139/68, pulse 76, temperature 97.6 F (36.4 C), temperature source Tympanic, resp. rate 18, height 4' 11" (1.499 m), weight 125 lb 12.8 oz (57.1 kg), last menstrual period 05/10/1997, SpO2 100 %.  Physical Exam: WD in NAD Neck  Supple NROM, without any enlargements.  Lymph Node Survey No cervical supraclavicular or inguinal adenopathy Cardiovascular  Well perfused peripheries Lungs  No increased WOB Skin  No rash/lesions/breakdown  Psychiatry  Alert and oriented to person, place, and time  Abdomen  Normoactive bowel sounds, abdomen soft, non-tender and well healed incisions.  Back No CVA tenderness Genito Urinary  Speculum exam reveals moderate volume of clear/blood tinged fluid pooling in the posterior vaginal vault. The vaginal cuff is in tact both visibly and  palpably.  Rectal  deferred Extremities  No bilateral cyanosis, clubbing or edema.  [name], MD  10/10/2020, 2:49 PM

## 2020-10-11 LAB — CREATININE, BODY FLUID OTHER: Creatinine, Body Fluid: 41.5 mg/dL

## 2020-10-13 ENCOUNTER — Encounter (HOSPITAL_COMMUNITY): Payer: Self-pay

## 2020-10-13 ENCOUNTER — Other Ambulatory Visit: Payer: Self-pay | Admitting: Oncology

## 2020-10-13 ENCOUNTER — Other Ambulatory Visit: Payer: Self-pay

## 2020-10-13 ENCOUNTER — Telehealth: Payer: Self-pay | Admitting: Oncology

## 2020-10-13 ENCOUNTER — Other Ambulatory Visit: Payer: Self-pay | Admitting: Urology

## 2020-10-13 ENCOUNTER — Ambulatory Visit (HOSPITAL_COMMUNITY)
Admission: RE | Admit: 2020-10-13 | Discharge: 2020-10-13 | Disposition: A | Discharge status: Home / Self Care | Payer: Managed Care, Other (non HMO) | Source: Ambulatory Visit | Attending: Gynecologic Oncology | Admitting: Gynecologic Oncology

## 2020-10-13 ENCOUNTER — Encounter: Payer: Self-pay | Admitting: Gynecologic Oncology

## 2020-10-13 DIAGNOSIS — R310 Gross hematuria: Secondary | ICD-10-CM

## 2020-10-13 LAB — POCT I-STAT CREATININE: Creatinine, Ser: 0.5 mg/dL (ref 0.44–1.00)

## 2020-10-13 MED ORDER — IOHEXOL 300 MG/ML  SOLN
100.0000 mL | Freq: Once | INTRAMUSCULAR | Status: AC | PRN
  Administered 2020-10-13: 100 mL via INTRAVENOUS

## 2020-10-13 MED ORDER — IOTHALAMATE MEGLUMINE 17.2 % UR SOLN: 250.0000 mL | Freq: Once | URETHRAL | Status: DC | PRN

## 2020-10-13 NOTE — Progress Notes (Signed)
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  Recommendation is for cystoscopy with attempted stent placement. She may need a left nephrostomy tube if this is unsuccessful.  She will have to cancel her upcoming trip for 10/16/20.  Dr Louis Meckel from [facility] is planning the cystocopic procedure on 10/16/20.   Explained to patient and her son what the injury was and the proposed plan for correction. All questions answered. Communication provided for her travel agent/cruise line to cancel upcoming trip.  Foley removed.  [name], MD

## 2020-10-13 NOTE — Telephone Encounter (Signed)
Called [employer] regarding insurance coverage.  She said she is active with [place].  She has questions regarding whether she will need to stay after having the scans and who would do the repairs if needed on her bladder. Advised I will call her back after getting the scans scheduled.  Called [place], spoke with Verdis Frederickson, and verified that patient is currently active.  Also checked status on CT cystogram.  Per Verdis Frederickson, no prior authorization is needed.

## 2020-10-13 NOTE — Telephone Encounter (Signed)
Called Sheila Haynes and left a message not to eat or drink anything in case she will need a procedure latter today.  Also advised to come to the [facility] after her scans to review the results with Dr. Denman George.    Also called [employer] regarding authorization for CT Cystogram.  Asked to try CPT 72192 instead of 16967.  They have put it into urgent review with results in less than 4 hours.

## 2020-10-13 NOTE — Patient Instructions (Signed)
There is a hole in the left ureter tube (close to where it connects to the bladder). The recommendation is to place a stent tube in the ureter to hold this open and help it to heal. This is placed during a procedure called "cystoscopy". This is performed by a urologist and will be performed by Dr Louis Meckel from [facility] [phone]) on Thursday June 9th.   Unfortunately this will mean that you will not be able to go on the cruise this week. Dr Serita Grit office will work through your travel agent to help with explaining why this is not possible due to emergent health issues.   If Dr Louis Meckel is not able to pass the stent into the ureter (due to swelling), he will facilitate a procedure called a nephrostomy tube in which the tube is placed directly into the kidney.  If the stent procedure (or nephrostomy procedure) does not result in the hole healing spontaneously, you will need to have a surgical procedure to re-implant the ureter directly into the bladder.  Dr Herrick's office ([facility]) will be touch over the next 48 hours regarding your planned procedure. If you are having trouble in advance of this, Dr Serita Grit office can be reached at [phone].

## 2020-10-14 ENCOUNTER — Encounter (HOSPITAL_BASED_OUTPATIENT_CLINIC_OR_DEPARTMENT_OTHER): Payer: Self-pay | Admitting: Urology

## 2020-10-14 ENCOUNTER — Other Ambulatory Visit: Payer: Self-pay

## 2020-10-14 NOTE — Progress Notes (Signed)
Spoke w/ via phone for pre-op interview--- Pt Lab needs dos----  no             Lab results------ no COVID test -----patient states asymptomatic no test needed Arrive at ------- 0930 on 10-16-2020 NPO after MN NO Solid Food.  Clear liquids from MN until--- 0830 Med rec completed Medications to take morning of surgery ----- NONE Diabetic medication ----- n/a Patient instructed no nail polish to be worn day of surgery Patient instructed to bring photo id and insurance card day of surgery Patient aware to have Driver (ride ) / caregiver    for 24 hours after surgery -- husband Sheila Haynes Patient Special Instructions ----- n/a Pre-Op special Istructions ----- n/a Patient verbalized understanding of instructions that were given at this phone interview. Patient denies shortness of breath, chest pain, fever, cough at this phone interview.

## 2020-10-16 ENCOUNTER — Ambulatory Visit (HOSPITAL_BASED_OUTPATIENT_CLINIC_OR_DEPARTMENT_OTHER)
Admission: RE | Admit: 2020-10-16 | Discharge: 2020-10-16 | Disposition: A | Discharge status: Home / Self Care | Payer: Managed Care, Other (non HMO) | Attending: Urology | Admitting: Urology

## 2020-10-16 ENCOUNTER — Encounter (HOSPITAL_BASED_OUTPATIENT_CLINIC_OR_DEPARTMENT_OTHER): Payer: Self-pay | Admitting: Urology

## 2020-10-16 ENCOUNTER — Encounter (HOSPITAL_BASED_OUTPATIENT_CLINIC_OR_DEPARTMENT_OTHER)
Admission: RE | Disposition: A | Discharge status: Home / Self Care | Payer: Self-pay | Source: Home / Self Care | Attending: Urology

## 2020-10-16 ENCOUNTER — Ambulatory Visit (HOSPITAL_BASED_OUTPATIENT_CLINIC_OR_DEPARTMENT_OTHER): Payer: Managed Care, Other (non HMO) | Admitting: Anesthesiology

## 2020-10-16 ENCOUNTER — Other Ambulatory Visit: Payer: Self-pay

## 2020-10-16 DIAGNOSIS — Z90710 Acquired absence of both cervix and uterus: Secondary | ICD-10-CM | POA: Diagnosis not present

## 2020-10-16 DIAGNOSIS — X58XXXA Exposure to other specified factors, initial encounter: Secondary | ICD-10-CM | POA: Diagnosis not present

## 2020-10-16 DIAGNOSIS — S3710XA Unspecified injury of ureter, initial encounter: Secondary | ICD-10-CM | POA: Diagnosis present

## 2020-10-16 HISTORY — PX: CYSTOSCOPY WITH RETROGRADE PYELOGRAM, URETEROSCOPY AND STENT PLACEMENT: SHX5789

## 2020-10-16 HISTORY — DX: Unspecified injury of ureter, initial encounter: S37.10XA

## 2020-10-16 HISTORY — DX: Unspecified urinary incontinence: R32

## 2020-10-16 SURGERY — CYSTOURETEROSCOPY, WITH RETROGRADE PYELOGRAM AND STENT INSERTION
Anesthesia: General | Site: Pelvis | Laterality: Bilateral

## 2020-10-16 MED ORDER — ONDANSETRON HCL 4 MG/2ML IJ SOLN
INTRAMUSCULAR | Status: DC | PRN
  Administered 2020-10-16: 4 mg via INTRAVENOUS

## 2020-10-16 MED ORDER — FENTANYL CITRATE (PF) 100 MCG/2ML IJ SOLN
INTRAMUSCULAR | Status: DC | PRN
  Administered 2020-10-16 (×2): 50 ug via INTRAVENOUS

## 2020-10-16 MED ORDER — BELLADONNA ALKALOIDS-OPIUM 16.2-60 MG RE SUPP
RECTAL | Status: AC
  Filled 2020-10-16: qty 1

## 2020-10-16 MED ORDER — MIDAZOLAM HCL 5 MG/5ML IJ SOLN
INTRAMUSCULAR | Status: DC | PRN
  Administered 2020-10-16: 2 mg via INTRAVENOUS

## 2020-10-16 MED ORDER — FENTANYL CITRATE (PF) 100 MCG/2ML IJ SOLN
INTRAMUSCULAR | Status: AC
  Filled 2020-10-16: qty 2

## 2020-10-16 MED ORDER — PHENAZOPYRIDINE HCL 200 MG PO TABS: 200.0000 mg | ORAL_TABLET | Freq: Three times a day (TID) | ORAL | 0 refills | Status: AC | PRN

## 2020-10-16 MED ORDER — CIPROFLOXACIN IN D5W 400 MG/200ML IV SOLN
INTRAVENOUS | Status: AC
  Filled 2020-10-16: qty 200

## 2020-10-16 MED ORDER — CIPROFLOXACIN IN D5W 400 MG/200ML IV SOLN
400.0000 mg | INTRAVENOUS | Status: AC
  Administered 2020-10-16: 400 mg via INTRAVENOUS

## 2020-10-16 MED ORDER — FENTANYL CITRATE (PF) 100 MCG/2ML IJ SOLN: 25.0000 ug | INTRAMUSCULAR | Status: DC | PRN

## 2020-10-16 MED ORDER — DEXAMETHASONE SODIUM PHOSPHATE 4 MG/ML IJ SOLN
INTRAMUSCULAR | Status: DC | PRN
  Administered 2020-10-16: 6 mg via INTRAVENOUS

## 2020-10-16 MED ORDER — LIDOCAINE HCL (PF) 2 % IJ SOLN
INTRAMUSCULAR | Status: AC
  Filled 2020-10-16: qty 5

## 2020-10-16 MED ORDER — FLUORESCEIN SODIUM 10 % IV SOLN
INTRAVENOUS | Status: AC
  Filled 2020-10-16: qty 5

## 2020-10-16 MED ORDER — MIDAZOLAM HCL 2 MG/2ML IJ SOLN
INTRAMUSCULAR | Status: AC
  Filled 2020-10-16: qty 2

## 2020-10-16 MED ORDER — SODIUM CHLORIDE 0.9 % IR SOLN
Status: DC | PRN
  Administered 2020-10-16: 3000 mL

## 2020-10-16 MED ORDER — ONDANSETRON HCL 4 MG/2ML IJ SOLN
INTRAMUSCULAR | Status: AC
  Filled 2020-10-16: qty 2

## 2020-10-16 MED ORDER — LACTATED RINGERS IV SOLN: INTRAVENOUS | Status: DC

## 2020-10-16 MED ORDER — PROPOFOL 10 MG/ML IV BOLUS
INTRAVENOUS | Status: DC | PRN
  Administered 2020-10-16: 130 mg via INTRAVENOUS

## 2020-10-16 MED ORDER — LIDOCAINE HCL (CARDIAC) PF 100 MG/5ML IV SOSY
PREFILLED_SYRINGE | INTRAVENOUS | Status: DC | PRN
  Administered 2020-10-16: 30 mg via INTRAVENOUS

## 2020-10-16 MED ORDER — FLUORESCEIN SODIUM 10 % IV SOLN
INTRAVENOUS | Status: DC | PRN
  Administered 2020-10-16: 100 mg via INTRAVENOUS

## 2020-10-16 MED ORDER — BELLADONNA ALKALOIDS-OPIUM 16.2-60 MG RE SUPP
RECTAL | Status: DC | PRN
  Administered 2020-10-16: 1 via RECTAL

## 2020-10-16 MED ORDER — DEXAMETHASONE SODIUM PHOSPHATE 10 MG/ML IJ SOLN
INTRAMUSCULAR | Status: AC
  Filled 2020-10-16: qty 1

## 2020-10-16 MED ORDER — IOHEXOL 300 MG/ML  SOLN
INTRAMUSCULAR | Status: DC | PRN
  Administered 2020-10-16: 10 mL via URETHRAL

## 2020-10-16 SURGICAL SUPPLY — 22 items
BAG DRAIN URO-CYSTO SKYTR STRL (DRAIN) ×3 IMPLANT
BAG DRN UROCATH (DRAIN) ×1
BASKET LASER NITINOL 1.9FR (BASKET) IMPLANT
BSKT STON RTRVL 120 1.9FR (BASKET)
CATH URET 5FR 28IN OPEN ENDED (CATHETERS) ×3 IMPLANT
CATH URET DUAL LUMEN 6-10FR 50 (CATHETERS) ×3 IMPLANT
CLOTH BEACON ORANGE TIMEOUT ST (SAFETY) ×3 IMPLANT
EXTRACTOR STONE 1.7FRX115CM (UROLOGICAL SUPPLIES) IMPLANT
GLOVE SURG ENC MOIS LTX SZ7.5 (GLOVE) ×3 IMPLANT
GOWN STRL REUS W/TWL XL LVL3 (GOWN DISPOSABLE) ×3 IMPLANT
GUIDEWIRE STR DUAL SENSOR (WIRE) ×3 IMPLANT
IV NS IRRIG 3000ML ARTHROMATIC (IV SOLUTION) ×6 IMPLANT
KIT TURNOVER CYSTO (KITS) ×3 IMPLANT
MANIFOLD NEPTUNE II (INSTRUMENTS) ×3 IMPLANT
NS IRRIG 500ML POUR BTL (IV SOLUTION) ×3 IMPLANT
PACK CYSTO (CUSTOM PROCEDURE TRAY) ×3 IMPLANT
STENT POLARIS 5FRX22 (STENTS) ×3 IMPLANT
TRACTIP FLEXIVA PULS ID 200XHI (Laser) IMPLANT
TRACTIP FLEXIVA PULSE ID 200 (Laser)
TUBE CONNECTING 12'X1/4 (SUCTIONS) ×1
TUBE CONNECTING 12X1/4 (SUCTIONS) ×2 IMPLANT
TUBING UROLOGY SET (TUBING) ×3 IMPLANT

## 2020-10-16 NOTE — H&P (Signed)
Cc: left ureteral injury  History of present illness: 60 year old female who underwent a robotic assisted hysterectomy for heavy menses and abnormal cells.  The surgery was routine and the patient was discharged home on the same day.  On day 14 she started develop some drainage per her vagina.  Subsequent follow-up and imaging demonstrated a possible left ureteral injury.  The patient presents today for further evaluation and left stent placement.  The patient did well for 2 weeks, and then noted some slight pressure and the feeling of having to urinate.  This happened twice before she started to have some significant leakage.  She had no control of her bladder.  She was started on antibiotics by her primary care doctor, and then subsequently switched to a different antibiotic once things did not improve.  She then was reevaluated by Dr. Denman George, and was noted to have a left ureteral injury.  The patient denies any fevers or chills.  She denies any dysuria.  She denies any gross hematuria.  She has persistent drainage per vagina, going through a poise pad every hour.  Review of systems: A 12 point comprehensive review of systems was obtained and is negative unless otherwise stated in the history of present illness.  Patient Active Problem List   Diagnosis Date Noted   Postmenopausal bleeding 09/02/2020   Cervical mass 09/02/2020   LGSIL on Pap smear of cervix 09/02/2020    No current facility-administered medications on file prior to encounter.   Current Outpatient Medications on File Prior to Encounter  Medication Sig Dispense Refill   multivitamin-lutein (OCUVITE-LUTEIN) CAPS capsule Take 1 capsule by mouth daily. (Patient not taking: Reported on 10/10/2020)     Omega-3 Fatty Acids (FISH OIL) 1200 MG CAPS Take 1,200 mg by mouth daily. (Patient not taking: Reported on 10/10/2020)      Past Medical History:  Diagnosis Date   Atypical squamous cell changes of undetermined significance (ASCUS) on  cervical cytology with negative high risk human papilloma virus (HPV) test result    Left ureteral injury    post op robotic assisted total hysterectomy w/ bso 09-15-2020   Urinary incontinence    Wears glasses     Past Surgical History:  Procedure Laterality Date   APPENDECTOMY  1999   CESAREAN SECTION  06/25/1991   Cesaren section  1988   OOPHORECTOMY Right 09/1997   ROBOTIC ASSISTED TOTAL HYSTERECTOMY WITH BILATERAL SALPINGO OOPHERECTOMY Bilateral 09/15/2020   Procedure: XI ROBOTIC ASSISTED TOTAL HYSTERECTOMY WITH BILATERAL SALPINGO OOPHORECTOMY, PELVIC WASHING;  Surgeon: Everitt Amber, MD;  Location: [facility];  Service: Gynecology;  Laterality: Bilateral;    Social History   Tobacco Use   Smoking status: Never   Smokeless tobacco: Never  Vaping Use   Vaping Use: Never used  Substance Use Topics   Alcohol use: Never   Drug use: Not Currently    Family History  Problem Relation Age of Onset   Uterine cancer Mother    Multiple myeloma Mother    Hypertension Father    Heart disease Father    Diabetes Father    Stomach cancer Maternal Grandmother    Brain cancer Maternal Grandfather    Uterine cancer Paternal Grandmother    Pancreatic cancer Paternal Uncle    Pancreatic cancer Paternal Uncle    Colon cancer Neg Hx    Ovarian cancer Neg Hx    Breast cancer Neg Hx    Prostate cancer Neg Hx     PE: Vitals:  10/14/20 1615  Weight: 57.1 kg  Height: 4' 11"  (1.499 m)   Patient appears to be in no acute distress  patient is alert and oriented x3 Atraumatic normocephalic head No cervical or supraclavicular lymphadenopathy appreciated No increased work of breathing, no audible wheezes/rhonchi Regular sinus rhythm/rate Abdomen is soft, nontender, nondistended, no CVA or suprapubic tenderness Lower extremities are symmetric without appreciable edema Grossly neurologically intact No identifiable skin lesions  No results for input(s): WBC, HGB, HCT in  the last 72 hours. Recent Labs    10/13/20 1434  CREATININE 0.50   No results for input(s): LABPT, INR in the last 72 hours. No results for input(s): LABURIN in the last 72 hours. Results for orders placed or performed during the hospital encounter of 09/12/20  SARS CORONAVIRUS 2 (TAT 6-24 HRS) Nasopharyngeal Nasopharyngeal Swab     Status: None   Collection Time: 09/12/20 10:44 AM   Specimen: Nasopharyngeal Swab  Result Value Ref Range Status   SARS Coronavirus 2 NEGATIVE NEGATIVE Final    Comment: (NOTE) SARS-CoV-2 target nucleic acids are NOT DETECTED.  The SARS-CoV-2 RNA is generally detectable in upper and lower respiratory specimens during the acute phase of infection. Negative results do not preclude SARS-CoV-2 infection, do not rule out co-infections with other pathogens, and should not be used as the sole basis for treatment or other patient management decisions. Negative results must be combined with clinical observations, patient history, and epidemiological information. The expected result is Negative.  Fact Sheet for Patients: SugarRoll.be  Fact Sheet for Healthcare Providers: https://www.woods-mathews.com/  This test is not yet approved or cleared by the [place] FDA and  has been authorized for detection and/or diagnosis of SARS-CoV-2 by FDA under an Emergency Use Authorization (EUA). This EUA will remain  in effect (meaning this test can be used) for the duration of the COVID-19 declaration under Se ction 564(b)(1) of the Act, 21 U.S.C. section 360bbb-3(b)(1), unless the authorization is terminated or revoked sooner.  Performed at [facility], [place] [address], [place], [place] [address]     Imaging: I independently reviewed the patient's CT scan, CT of the abdomen and pelvis with delayed imaging demonstrating urine extravasation in the left distal ureter with pooling in her vaginal vault.  There is mild  hydroureteronephrosis.  The right collecting system is normal.  Imp: Patient has a left ureteral injury status post robotic assisted hysterectomy.  Recommendations: Plan is to investigate this in the operating room with cystoscopy and retrograde pyelograms.  Possibly, I will perform rigid ureteroscopy of the left ureter from more evaluation.  Ultimately, I would like to get a stent across that injured portion of the ureter.  If this fails, the patient will need a left nephrostomy tube.  I went through this with the patient in detail.  I explained to her the associated risks and benefits of this operation.  Answered all her questions.  Ultimately, the patient has agreed to proceed.   Ardis Hughs

## 2020-10-16 NOTE — Transfer of Care (Signed)
Immediate Anesthesia Transfer of Care Note  Patient: Sheila Haynes  Procedure(s) Performed: CYSTOSCOPY WITH BILATERAL RETROGRADE PYELOGRAM, LEFT URETERAL STENT PLACEMENT/ LEFT URETEROSCOPY (Bilateral: Pelvis)  Patient Location: PACU  Anesthesia Type:General  Level of Consciousness: awake  Airway & Oxygen Therapy: Patient Spontanous Breathing and Patient connected to face mask oxygen  Post-op Assessment: Report given to RN and Post -op Vital signs reviewed and stable  Post vital signs: Reviewed and stable  Last Vitals:  Vitals Value Taken Time  BP 121/68 10/16/20 1237  Temp    Pulse 66 10/16/20 1238  Resp 12 10/16/20 1238  SpO2 99 % 10/16/20 1238  Vitals shown include unvalidated device data.  Last Pain:  Vitals:   10/16/20 0954  TempSrc: Oral  PainSc: 0-No pain         Complications: No notable events documented.

## 2020-10-16 NOTE — Discharge Instructions (Addendum)
DISCHARGE INSTRUCTIONS FOR KIDNEY STONE/URETERAL STENT   MEDICATIONS:  1. Resume all your other meds from home - except do not take any extra narcotic pain meds that you may have at home.  2. Pyridium is to help with the burning/stinging when you urinate. 3. Taking upto 1000 mg every 6 hours of plainTylenol will help treat your pain.    ACTIVITY:  1. No strenuous activity x 1week  2. No driving while on narcotic pain medications  3. Drink plenty of water  4. Continue to walk at home - you can still get blood clots when you are at home, so keep active, but don't over do it.  5. May return to work/school tomorrow or when you feel ready   BATHING:  1. You can shower and we recommend daily showers    SIGNS/SYMPTOMS TO CALL:  Please call us if you have a fever greater than 101.5, uncontrolled nausea/vomiting, uncontrolled pain, dizziness, unable to urinate, bloody urine, chest pain, shortness of breath, leg swelling, leg pain, redness around wound, drainage from wound, or any other concerns or questions.   You can reach [place] at [phone].   FOLLOW-UP:  1. You will be scheduled for follow-up in one month.   Post Anesthesia Home Care Instructions  Activity: Get plenty of rest for the remainder of the day. A responsible individual must stay with you for 24 hours following the procedure.  For the next 24 hours, DO NOT: -Drive a car -Paediatric nurse -Drink alcoholic beverages -Take any medication unless instructed by your physician -Make any legal decisions or sign important papers.  Meals: Start with liquid foods such as gelatin or soup. Progress to regular foods as tolerated. Avoid greasy, spicy, heavy foods. If nausea and/or vomiting occur, drink only clear liquids until the nausea and/or vomiting subsides. Call your physician if vomiting continues.  Special Instructions/Symptoms: Your throat may feel dry or sore from the anesthesia or the breathing tube placed in your throat  during surgery. If this causes discomfort, gargle with warm salt water. The discomfort should disappear within 24 hours.  If you had a scopolamine patch placed behind your ear for the management of post- operative nausea and/or vomiting:  1. The medication in the patch is effective for 72 hours, after which it should be removed.  Wrap patch in a tissue and discard in the trash. Wash hands thoroughly with soap and water. 2. You may remove the patch earlier than 72 hours if you experience unpleasant side effects which may include dry mouth, dizziness or visual disturbances. 3. Avoid touching the patch. Wash your hands with soap and water after contact with the patch.

## 2020-10-16 NOTE — Op Note (Signed)
Preoperative diagnosis:  Left ureteral injury  Postoperative diagnosis:  Same  Procedure: Cystoscopy, bilateral retrograde pyelogram with interpretation Left diagnostic ureteroscopy Left ureteral stent placement  Surgeon: Ardis Hughs, MD  Anesthesia: General  Complications: None  Intraoperative findings:  #1: There was bullous edema at the trigone of the bladder, likely induced from her previous Foley catheter. #2: The patient's right retrograde pyelogram was performed using 10 cc of Omnipaque contrast through a 5 [place] open-ended ureteral catheter, this demonstrated normal caliber ureter with no filling defects or hydronephrosis.  The course of the ureter was as expected along the transverse process. #3: The patient's left retrograde pyelogram was performed using a dual-lumen catheter after the wire was advanced into the patient's left renal pelvis.  The dual-lumen was placed at the ureteral orifice and 10 cc of Omnipaque contrast was instilled.  This demonstrated extravasation medially at the UVJ/distal ureter, but there was some contrast that passed beyond this area.  There was some mild hydroureteronephrosis all the way up to the renal pelvis.  There were no additional filling defects or abnormalities. #4: Ureteroscopy was performed in the left ureter and this demonstrated a 1 cm defect involving approximately 40% of the ureter on the medial aspect.  There were no clips or stitches noted.  This is likely a reflection of a thermal injury.  EBL: Minimal  Specimens: None  Indication: Sheila Haynes is a 60 y.o. patient with atypical cells and abnormal heavy menses and is status post robotic assisted hysterectomy.  She presented to her postop appointment 4 weeks later with uncontrolled incontinence.  CT scan was performed and demonstrated a left ureteral injury with contrast extravasation down around the bladder.  After reviewing the management options for treatment, she elected  to proceed with the above surgical procedure(s). We have discussed the potential benefits and risks of the procedure, side effects of the proposed treatment, the likelihood of the patient achieving the goals of the procedure, and any potential problems that might occur during the procedure or recuperation. Informed consent has been obtained.  Description of procedure:  The patient was taken to the operating room and general anesthesia was induced.  The patient was placed in the dorsal lithotomy position, prepped and draped in the usual sterile fashion, and preoperative antibiotics were administered. A preoperative time-out was performed.   21 French 30 degree cystoscope was gently passed to the patient's urethra and into the bladder under visual guidance.  Cystoscopy was performed with the above findings.  I then exchanged the 30 degree lens for the 70 degree lens and repeated the evaluation.  I was unable to readily identify the ureteral orifices because of the bullous edema subsequent to her Foley catheter.  As such, we did give 1 mg of fluorescein which help facilitate the cannulation of the patient's right ureteral orifice.  I used a 5 [place] open-ended ureteral catheter once I had cannulated the patient's ureter with the wire and performed a right retrograde pyelogram with the above findings.  I then went to the left-sided was able to find the ureter based on its location using the wire poke around in the area.  Once I was in the left ureter I advanced the open-ended catheter up to the renal pelvis and advanced a wire up into the renal pelvis removing the catheter over the wire.  I then exchanged the 5 [place] open-ended catheter for the dual-lumen catheter and performed retrograde with the above findings.  I then remove the dual-lumen catheter  and advanced a single-lumen short semirigid ureteroscope through the patient's urethra and cannulated the left ureteral orifice.  I advanced it up into the mid  ureter and slowly pulled back evaluating the ureter with the above findings.  I then backloaded the safety wire through the cystoscope and then repassed the scope into the patient's bladder.  I subsequently advanced a 22 cm x 5 French Polaris stent into the patient's left renal pelvis under fluoroscopic guidance.  Once the stent was noted to be well within the renal pelvis I advanced the stent into the patient's bladder prior to removing the wire completely.  Fluoroscopy confirmed that the stent was in a good position.  Her bladder was subsequently emptied.  A BNO suppository was placed in the patient's rectum.  She was subsequently extubated and returned to the PACU in stable condition.  Disposition: The patient will be scheduled for follow-up in 4 weeks for reevaluation.  Ardis Hughs, M.D.

## 2020-10-16 NOTE — Anesthesia Postprocedure Evaluation (Signed)
Anesthesia Post Note  Patient: Sheila Haynes  Procedure(s) Performed: CYSTOSCOPY WITH BILATERAL RETROGRADE PYELOGRAM, LEFT URETERAL STENT PLACEMENT/ LEFT URETEROSCOPY (Bilateral: Pelvis)     Patient location during evaluation: PACU Anesthesia Type: General Level of consciousness: awake Pain management: pain level controlled Vital Signs Assessment: post-procedure vital signs reviewed and stable Respiratory status: spontaneous breathing Cardiovascular status: stable Postop Assessment: no apparent nausea or vomiting Anesthetic complications: no   No notable events documented.  Last Vitals:  Vitals:   10/16/20 1236 10/16/20 1245  BP: 121/68 116/65  Pulse: 68 66  Resp: 10 13  Temp: 36.9 C   SpO2: 99% 100%    Last Pain:  Vitals:   10/16/20 1236  TempSrc:   PainSc: Asleep                 Rorie Delmore

## 2020-10-16 NOTE — Anesthesia Procedure Notes (Addendum)
Procedure Name: LMA Insertion Date/Time: 10/16/2020 11:34 AM Performed by: Lieutenant Diego, CRNA Pre-anesthesia Checklist: Patient identified, Emergency Drugs available, Suction available and Patient being monitored Patient Re-evaluated:Patient Re-evaluated prior to induction Oxygen Delivery Method: Circle system utilized Preoxygenation: Pre-oxygenation with 100% oxygen Induction Type: IV induction Ventilation: Mask ventilation without difficulty LMA: LMA inserted LMA Size: 3.0 Number of attempts: 1 Placement Confirmation: positive ETCO2 and breath sounds checked- equal and bilateral Tube secured with: Tape Dental Injury: Teeth and Oropharynx as per pre-operative assessment

## 2020-10-16 NOTE — Anesthesia Preprocedure Evaluation (Addendum)
Anesthesia Evaluation  Patient identified by MRN, date of birth, ID band Patient awake    Reviewed: Allergy & Precautions, NPO status , Patient's Chart, lab work & pertinent test results  Airway Mallampati: II  TM Distance: >3 FB     Dental   Pulmonary neg pulmonary ROS,    breath sounds clear to auscultation       Cardiovascular negative cardio ROS   Rhythm:Regular Rate:Normal     Neuro/Psych negative neurological ROS     GI/Hepatic negative GI ROS, Neg liver ROS,   Endo/Other  negative endocrine ROS  Renal/GU negative Renal ROS     Musculoskeletal   Abdominal   Peds  Hematology negative hematology ROS (+)   Anesthesia Other Findings   Reproductive/Obstetrics                             Anesthesia Physical Anesthesia Plan  ASA: 2  Anesthesia Plan: General   Post-op Pain Management:    Induction: Intravenous  PONV Risk Score and Plan: 3 and Ondansetron, Dexamethasone and Midazolam  Airway Management Planned: LMA  Additional Equipment:   Intra-op Plan:   Post-operative Plan: Extubation in OR  Informed Consent: I have reviewed the patients History and Physical, chart, labs and discussed the procedure including the risks, benefits and alternatives for the proposed anesthesia with the patient or authorized representative who has indicated his/her understanding and acceptance.     Dental advisory given  Plan Discussed with: CRNA and Anesthesiologist  Anesthesia Plan Comments:         Anesthesia Quick Evaluation

## 2020-10-17 ENCOUNTER — Encounter (HOSPITAL_BASED_OUTPATIENT_CLINIC_OR_DEPARTMENT_OTHER): Payer: Self-pay | Admitting: Urology

## 2020-12-16 ENCOUNTER — Telehealth: Payer: Self-pay

## 2020-12-16 NOTE — Telephone Encounter (Signed)
LM for Sheila Haynes to call the office with an update as to how she is doing after the urinary stent removal,especially with urination.

## 2020-12-17 NOTE — Telephone Encounter (Signed)
Sheila Haynes states that she is feeling good and urinating well. Dr. Serita Grit office just want to be sure she was doing fine after the ureteral injury. Patient appreciative of call. No follow up is needed with Dr. Denman George per Zoila Shutter.

## 2022-04-30 IMAGING — CT CT ABD-PEL WO/W CM
3 of 12 series · 9 of 46 positions shown, 15 images · IV contrast (APPLIED)
Comparison: MRI pelvis 08/29/2020

CLINICAL DATA: Fluid leakage from vagina post hysterectomy,
assessment for source of leak.

EXAM:
CT ABDOMEN AND PELVIS WITHOUT AND WITH CONTRAST
TECHNIQUE: Multidetector CT imaging of the abdomen and pelvis was performed
following the standard protocol before and following the bolus
administration of intravenous contrast.
CONTRAST:  100mL OMNIPAQUE IOHEXOL 300 MG/ML  SOLN

[Series 6: cor pre · coronal · non-contrast · 0.69mm/px · 2 of 87 slices shown, 3 images]
[im 29/87  soft-tissue]
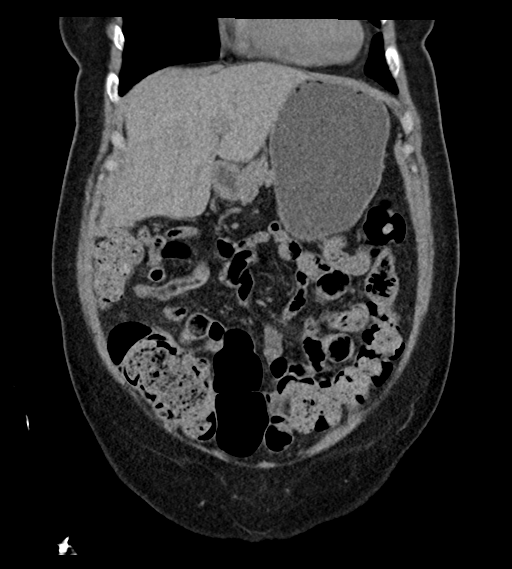
[im 29/87  bone]
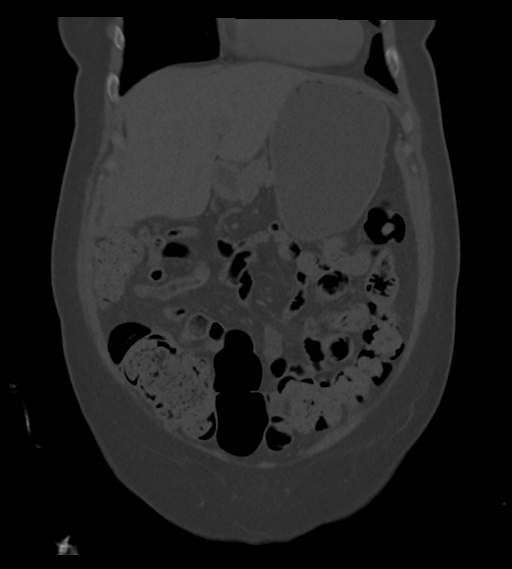
[im 58/87  soft-tissue]
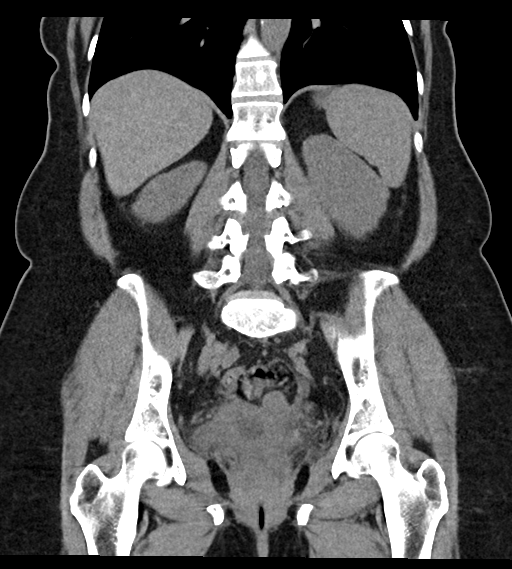

[Series 8: hematuria <45 with · axial · 0.72mm/px · z∈[-351,-276]mm · 2 of 75 slices shown]
[im 15/75  soft-tissue]
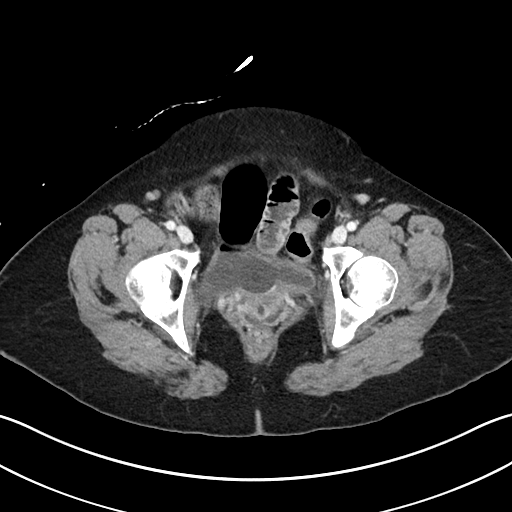
[im 30/75  soft-tissue]
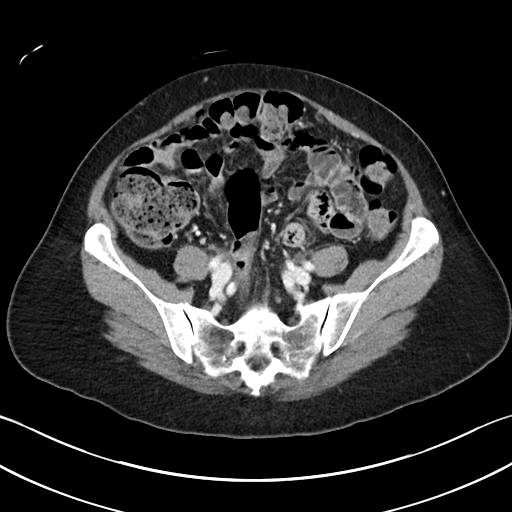

[Series 15: post prone · axial · 0.52mm/px · z∈[-358,-75]mm · 5 of 87 slices shown, 10 images]
[im 15/87  soft-tissue]
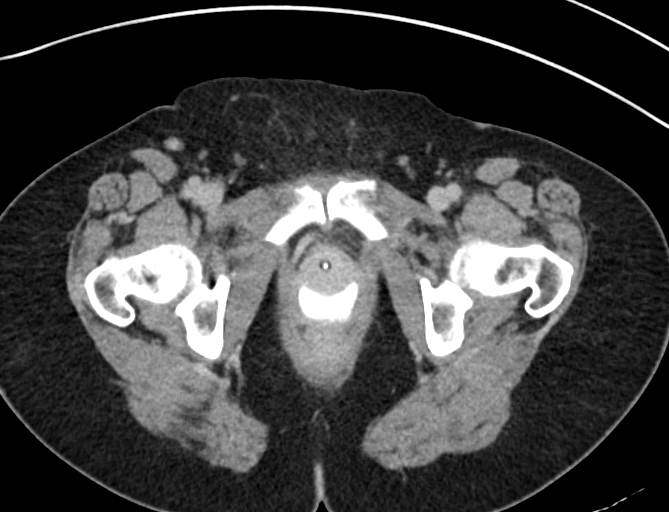
[im 15/87  bone]
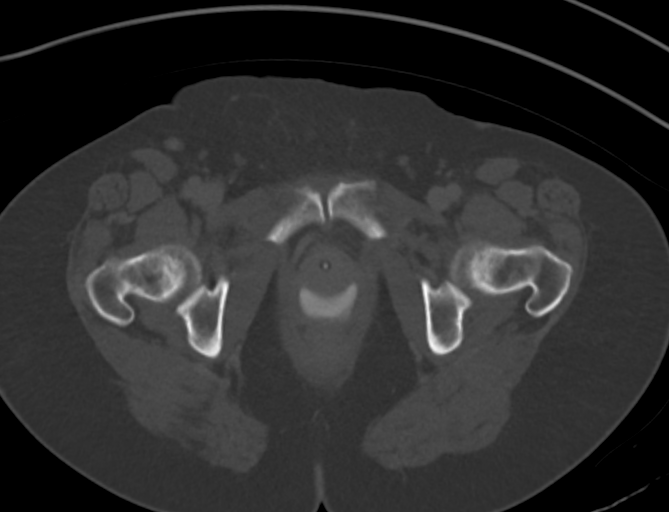
[im 29/87  soft-tissue]
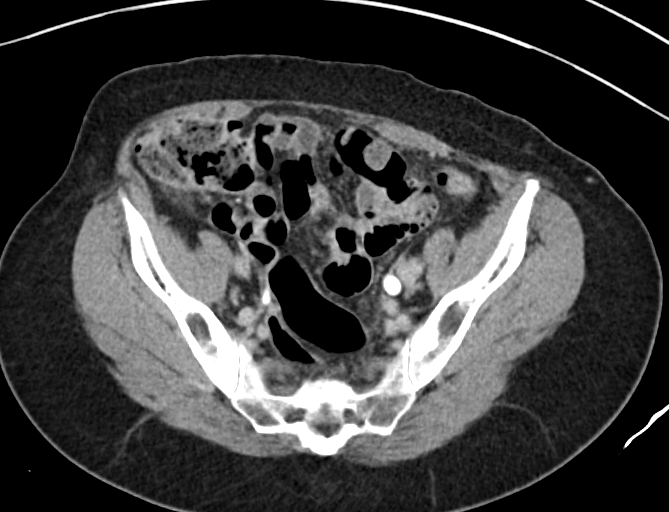
[im 29/87  lung]
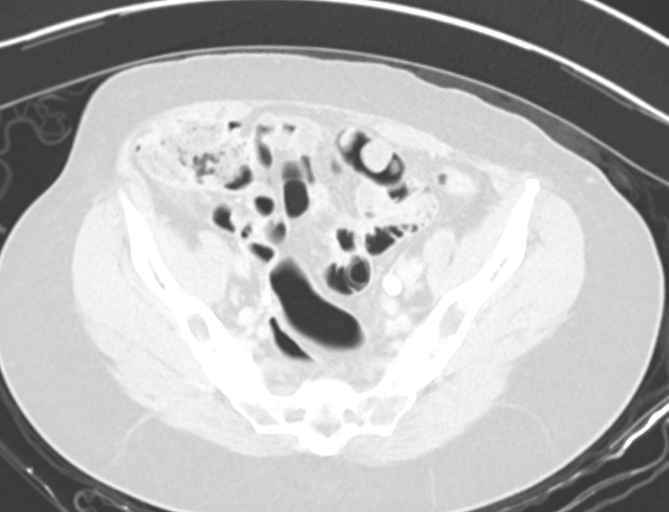
[im 44/87  soft-tissue]
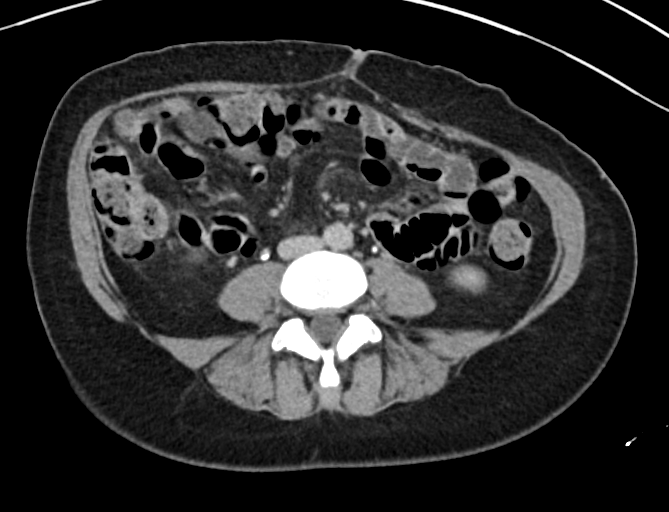
[im 44/87  lung]
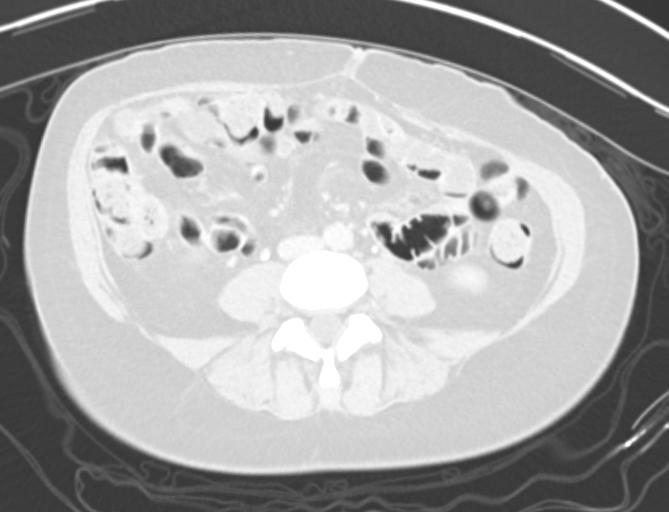
[im 58/87  soft-tissue]
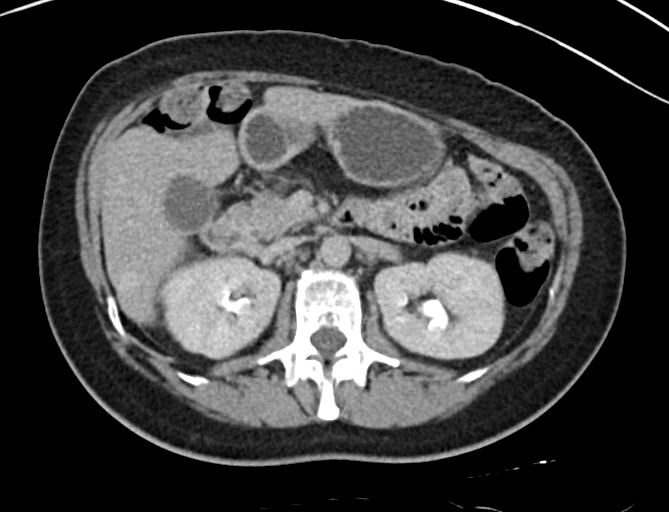
[im 58/87  lung]
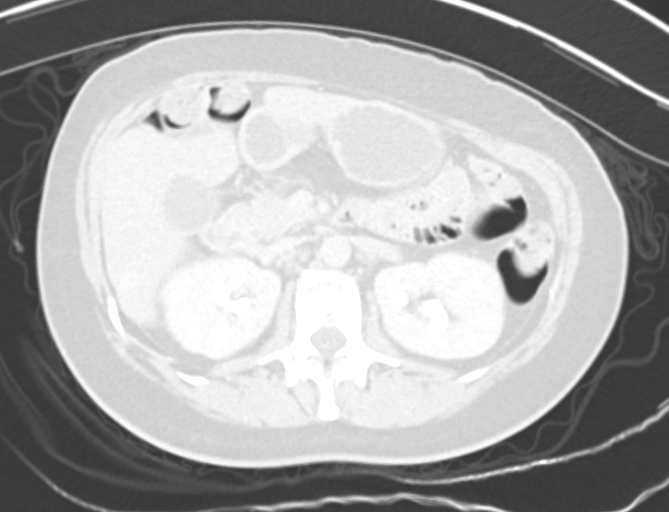
[im 72/87  soft-tissue]
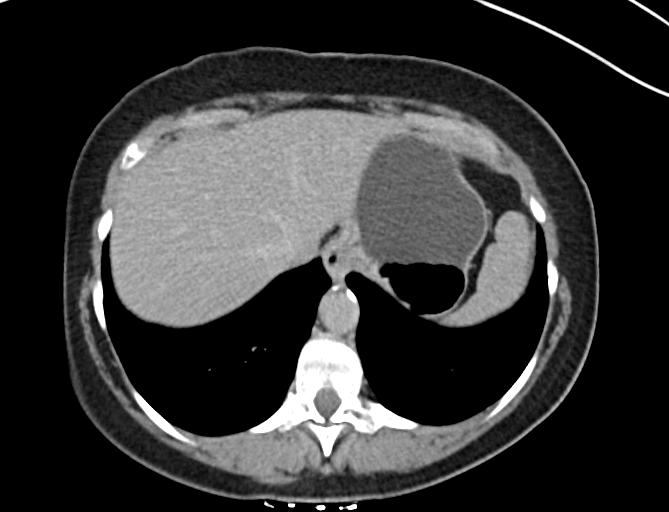
[im 72/87  lung]
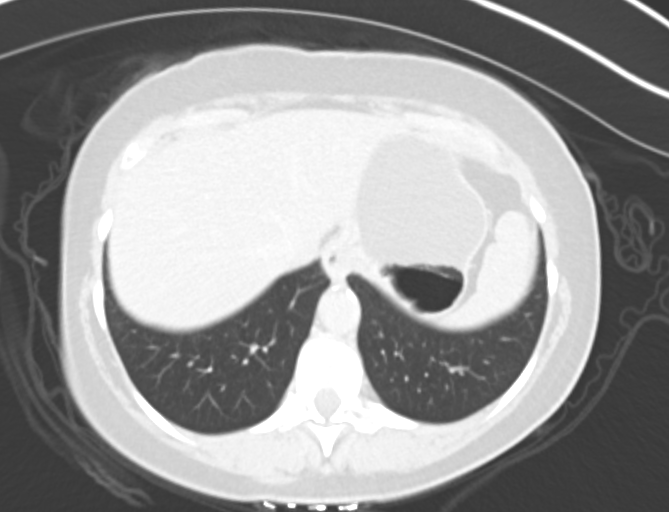

[9 of 46 positions shown; findings below may reference images not displayed]

FINDINGS: Lower chest: 0.3 cm right middle lobe subpleural nodule on image 6
series 17. Punctate calcifications in the left lower lobe on image 8
series 17. Descending thoracic aortic atherosclerosis.

Hepatobiliary: 1.5 by 1.1 cm lesion inferiorly in the right hepatic
lobe with enhancement along the margins on the portal venous phase
images and delayed diffuse enhancement on the delayed images
favoring hemangioma. Gallbladder unremarkable. No biliary
dilatation.

Pancreas: Unremarkable

Spleen: Unremarkable

Adrenals/Urinary Tract: Both adrenal glands appear normal.

Borderline left hydronephrosis and mild distal left hydroureter. The
left ureter terminates into an irregular bilobed fluid density
collection along the anterior margin of the vaginal cuff measuring
5.1 by 2.2 by 2.0 cm (volume = 12 cm^3). This collection in turn
has a linear extension down into the vaginal cuff, and there is
opacification of the vaginal vault with contrast on delayed images
indicating an connection between the ureter, bilobed collection, and
the vagina as shown for example on images 40 through 45 of series
18.

The distal right ureter appears unremarkable with expected extension
to the right UVJ.

There is a Foley catheter in the urinary bladder along with a small
amount of gas in the urinary bladder which is probably incidental.
Normal renal enhancement bilaterally.

No abnormal opacification of bowel lumen to suggest a connection to
the bowel.

Stomach/Bowel: Unremarkable

Vascular/Lymphatic: Aortoiliac atherosclerotic vascular disease.

Reproductive: Hysterectomy.  Adnexa unremarkable.

Other: No supplemental non-categorized findings.

Musculoskeletal: Sclerosis along the iliac side of both sacroiliac
joints suggesting mild osteitis condensans ilii.

Chronic bilateral pars defects at L5 with 3 mm of grade 1
anterolisthesis of L5 on S1.
IMPRESSION: 1. The left ureter empties into an abnormal irregular bilobed fluid
collection along the vaginal cuff, which in turn drains into the
vagina. There is mild left hydroureter and mild left hydronephrosis.
2. 0.3 cm a right middle lobe subpleural nodule. No follow-up needed
if patient is low-risk. Non-contrast chest CT can be considered in
12 months if patient is high-risk. This recommendation follows the
consensus statement: Guidelines for Management of Incidental
Pulmonary Nodules Detected on CT Images: From the [HOSPITAL]
3. 1.5 by 1.1 cm right hepatic lobe lesion inferiorly has delayed
enhancement favoring benign hepatic hemangioma.
4.  Aortic Atherosclerosis (BP2V0-QHU.U).
5. Chronic pars defects at L5 with 3 mm grade 1 anterolisthesis of
L5 on S1.
6. Mild osteitis condensans ilii.
